# Patient Record
Sex: Female | Born: 1970 | Race: White | Hispanic: No | Marital: Married | Smoking: Current every day smoker
Health system: Southern US, Community
[De-identification: ages and names within clinical notes are randomized; demographics above are authoritative.]

## PROBLEM LIST (undated history)

## (undated) DIAGNOSIS — F32A Depression, unspecified: Secondary | ICD-10-CM

## (undated) DIAGNOSIS — F419 Anxiety disorder, unspecified: Secondary | ICD-10-CM

## (undated) DIAGNOSIS — W5501XA Bitten by cat, initial encounter: Secondary | ICD-10-CM

## (undated) DIAGNOSIS — F329 Major depressive disorder, single episode, unspecified: Secondary | ICD-10-CM

## (undated) DIAGNOSIS — C801 Malignant (primary) neoplasm, unspecified: Secondary | ICD-10-CM

## (undated) HISTORY — PX: BREAST SURGERY: SHX581

---

## 1999-10-23 ENCOUNTER — Emergency Department (HOSPITAL_COMMUNITY): Admission: EM | Admit: 1999-10-23 | Discharge: 1999-10-23 | Payer: Self-pay | Admitting: Internal Medicine

## 2018-09-20 DIAGNOSIS — M009 Pyogenic arthritis, unspecified: Secondary | ICD-10-CM | POA: Insufficient documentation

## 2018-09-21 ENCOUNTER — Observation Stay (HOSPITAL_COMMUNITY)
Admission: EM | Admit: 2018-09-21 | Discharge: 2018-09-23 | Disposition: A | Discharge status: Home / Self Care | Payer: Medicare Other | Attending: Nephrology | Admitting: Nephrology

## 2018-09-21 DIAGNOSIS — L089 Local infection of the skin and subcutaneous tissue, unspecified: Secondary | ICD-10-CM

## 2018-09-21 DIAGNOSIS — S61451A Open bite of right hand, initial encounter: Secondary | ICD-10-CM | POA: Diagnosis present

## 2018-09-21 DIAGNOSIS — F418 Other specified anxiety disorders: Secondary | ICD-10-CM | POA: Diagnosis not present

## 2018-09-21 DIAGNOSIS — L03113 Cellulitis of right upper limb: Secondary | ICD-10-CM | POA: Insufficient documentation

## 2018-09-21 DIAGNOSIS — Z853 Personal history of malignant neoplasm of breast: Secondary | ICD-10-CM | POA: Insufficient documentation

## 2018-09-21 DIAGNOSIS — F17200 Nicotine dependence, unspecified, uncomplicated: Secondary | ICD-10-CM | POA: Insufficient documentation

## 2018-09-21 DIAGNOSIS — Z79899 Other long term (current) drug therapy: Secondary | ICD-10-CM | POA: Insufficient documentation

## 2018-09-21 DIAGNOSIS — W5501XA Bitten by cat, initial encounter: Secondary | ICD-10-CM | POA: Diagnosis not present

## 2018-09-21 DIAGNOSIS — L02511 Cutaneous abscess of right hand: Secondary | ICD-10-CM | POA: Diagnosis present

## 2018-09-21 HISTORY — DX: Anxiety disorder, unspecified: F41.9

## 2018-09-21 HISTORY — DX: Bitten by cat, initial encounter: W55.01XA

## 2018-09-21 HISTORY — DX: Malignant (primary) neoplasm, unspecified: C80.1

## 2018-09-21 HISTORY — DX: Major depressive disorder, single episode, unspecified: F32.9

## 2018-09-21 HISTORY — DX: Depression, unspecified: F32.A

## 2018-09-21 LAB — CBC WITH DIFFERENTIAL/PLATELET
Abs Immature Granulocytes: 0.04 10*3/uL (ref 0.00–0.07)
BASOS PCT: 0 %
Basophils Absolute: 0 10*3/uL (ref 0.0–0.1)
EOS ABS: 0.2 10*3/uL (ref 0.0–0.5)
Eosinophils Relative: 2 %
HCT: 40.6 % (ref 36.0–46.0)
Hemoglobin: 13.1 g/dL (ref 12.0–15.0)
Immature Granulocytes: 0 %
Lymphocytes Relative: 19 %
Lymphs Abs: 2.2 10*3/uL (ref 0.7–4.0)
MCH: 30.1 pg (ref 26.0–34.0)
MCHC: 32.3 g/dL (ref 30.0–36.0)
MCV: 93.3 fL (ref 80.0–100.0)
Monocytes Absolute: 1.2 10*3/uL — ABNORMAL HIGH (ref 0.1–1.0)
Monocytes Relative: 10 %
Neutro Abs: 8.4 10*3/uL — ABNORMAL HIGH (ref 1.7–7.7)
Neutrophils Relative %: 69 %
PLATELETS: 267 10*3/uL (ref 150–400)
RBC: 4.35 MIL/uL (ref 3.87–5.11)
RDW: 13 % (ref 11.5–15.5)
WBC: 12.1 10*3/uL — AB (ref 4.0–10.5)
nRBC: 0 % (ref 0.0–0.2)

## 2018-09-21 LAB — COMPREHENSIVE METABOLIC PANEL
ALT: 18 U/L (ref 0–44)
AST: 22 U/L (ref 15–41)
Albumin: 3.9 g/dL (ref 3.5–5.0)
Alkaline Phosphatase: 69 U/L (ref 38–126)
Anion gap: 12 (ref 5–15)
BUN: 16 mg/dL (ref 6–20)
CO2: 19 mmol/L — ABNORMAL LOW (ref 22–32)
Calcium: 9.2 mg/dL (ref 8.9–10.3)
Chloride: 103 mmol/L (ref 98–111)
Creatinine, Ser: 0.87 mg/dL (ref 0.44–1.00)
Glucose, Bld: 112 mg/dL — ABNORMAL HIGH (ref 70–99)
Potassium: 3.8 mmol/L (ref 3.5–5.1)
Sodium: 134 mmol/L — ABNORMAL LOW (ref 135–145)
Total Bilirubin: 0.4 mg/dL (ref 0.3–1.2)
Total Protein: 7 g/dL (ref 6.5–8.1)

## 2018-09-21 LAB — LACTIC ACID, PLASMA: Lactic Acid, Venous: 1.2 mmol/L (ref 0.5–1.9)

## 2018-09-21 LAB — I-STAT BETA HCG BLOOD, ED (MC, WL, AP ONLY)

## 2018-09-21 MED ORDER — SODIUM CHLORIDE 0.9% FLUSH
3.0000 mL | Freq: Once | INTRAVENOUS | Status: AC
  Administered 2018-09-22: 3 mL via INTRAVENOUS

## 2018-09-21 MED ORDER — SODIUM CHLORIDE 0.9 % IV SOLN
3.0000 g | Freq: Once | INTRAVENOUS | Status: AC
  Administered 2018-09-22: 3 g via INTRAVENOUS
  Filled 2018-09-21: qty 3

## 2018-09-21 MED ORDER — SULFAMETHOXAZOLE-TRIMETHOPRIM 800-160 MG PO TABS
1.0000 | ORAL_TABLET | Freq: Once | ORAL | Status: AC
  Administered 2018-09-22: 1 via ORAL
  Filled 2018-09-21: qty 1

## 2018-09-21 NOTE — ED Provider Notes (Addendum)
[facility] EMERGENCY DEPARTMENT Provider Note   CSN: [id number] Arrival date & time: 09/21/18  2107     History   Chief Complaint Chief Complaint  Patient presents with  . Animal Bite    HPI Audrey Becker is a 48 y.o. female.  Patient presents to the emergency department for evaluation of pain and swelling of her hand after cat bite.  She was bitten on the right hand by her cat while she was taking the cat to the vet 2 days ago.  She has at least 4 puncture wounds on the dorsal aspect of the right hand.  She reports increasing pain, redness, now spreading up to the forearm.  She has difficulty flexing her fingers because of pain.  She thinks she has been having fever at home.     No past medical history on file.  There are no active problems to display for this patient.      OB History   No obstetric history on file.      Home Medications    Prior to Admission medications   Not on File    Family History No family history on file.  Social History Social History   Tobacco Use  . Smoking status: Not on file  Substance Use Topics  . Alcohol use: Not on file  . Drug use: Not on file     Allergies   Patient has no allergy information on record.   Review of Systems Review of Systems  Constitutional: Positive for fever.  Skin: Positive for wound.  All other systems reviewed and are negative.    Physical Exam Updated Vital Signs BP 118/89 (BP Location: Right Arm)   Pulse 85   Temp 98.4 F (36.9 C) (Oral)   Resp 18   SpO2 98%   Physical Exam Vitals signs and nursing note reviewed.  Constitutional:      General: She is not in acute distress.    Appearance: Normal appearance. She is well-developed.  HENT:     Head: Normocephalic and atraumatic.     Right Ear: Hearing normal.     Left Ear: Hearing normal.     Nose: Nose normal.  Eyes:     Conjunctiva/sclera: Conjunctivae normal.     Pupils: Pupils are equal, round, and  reactive to light.  Neck:     Musculoskeletal: Normal range of motion and neck supple.  Cardiovascular:     Rate and Rhythm: Regular rhythm.     Heart sounds: S1 normal and S2 normal. No murmur. No friction rub. No gallop.   Pulmonary:     Effort: Pulmonary effort is normal. No respiratory distress.     Breath sounds: Normal breath sounds.  Chest:     Chest wall: No tenderness.  Abdominal:     General: Bowel sounds are normal.     Palpations: Abdomen is soft.     Tenderness: There is no abdominal tenderness. There is no guarding or rebound. Negative signs include Murphy's sign and McBurney's sign.     Hernia: No hernia is present.  Musculoskeletal: Normal range of motion.     Right hand: She exhibits tenderness (dorsal) and swelling.  Skin:    General: Skin is warm and dry.     Findings: No rash.     Comments: 4 small punctures noted on dorsal aspect of hand in the area of first and second metacarpals.  Overlying tenderness, erythema, warmth and swelling.  No fluctuance, induration or sign  of fluid collection.  Most of the hand is swollen and there is some erythema of the distal forearm  Neurological:     Mental Status: She is alert and oriented to person, place, and time.     GCS: GCS eye subscore is 4. GCS verbal subscore is 5. GCS motor subscore is 6.     Cranial Nerves: No cranial nerve deficit.     Sensory: No sensory deficit.     Coordination: Coordination normal.  Psychiatric:        Speech: Speech normal.        Behavior: Behavior normal.        Thought Content: Thought content normal.           ED Treatments / Results  Labs (all labs ordered are listed, but only abnormal results are displayed) Labs Reviewed  COMPREHENSIVE METABOLIC PANEL - Abnormal; Notable for the following components:      Result Value   Sodium 134 (*)    CO2 19 (*)    Glucose, Bld 112 (*)    All other components within normal limits  CBC WITH DIFFERENTIAL/PLATELET - Abnormal; Notable for  the following components:   WBC 12.1 (*)    Neutro Abs 8.4 (*)    Monocytes Absolute 1.2 (*)    All other components within normal limits  LACTIC ACID, PLASMA  I-STAT BETA HCG BLOOD, ED ([facility], [facility], [facility] ONLY)    EKG None  Radiology Dg Hand Complete Right  Result Date: 09/22/2018 CLINICAL DATA:  Right hand pain, swelling, redness, and warm to touch. Patient was bitten by cat 2 days ago. EXAM: RIGHT HAND - COMPLETE 3+ VIEW COMPARISON:  None. FINDINGS: Dorsal soft tissue swelling over the right hand. No radiopaque soft tissue foreign bodies or gas collections identified. Bones appear intact. No evidence of acute fracture or dislocation. No focal bone sclerosis or bone lesion. Joint spaces are preserved. IMPRESSION: Soft tissue swelling. No acute bony abnormalities. Electronically Signed   By: Lucienne Capers M.D.   On: 09/22/2018 00:30    Procedures Procedures (including critical care time)  Medications Ordered in ED Medications  sodium chloride flush (NS) 0.9 % injection 3 mL (has no administration in time range)  Ampicillin-Sulbactam (UNASYN) 3 g in sodium chloride 0.9 % 100 mL IVPB (3 g Intravenous New Bag/Given 09/22/18 0040)  sulfamethoxazole-trimethoprim (BACTRIM DS,SEPTRA DS) 800-160 MG per tablet 1 tablet (1 tablet Oral Given 09/22/18 0019)     Initial Impression / Assessment and Plan / ED Course  I have reviewed the triage vital signs and the nursing notes.  Pertinent labs & imaging results that were available during my care of the patient were reviewed by me and considered in my medical decision making (see chart for details).     Patient presents to the emergency department for evaluation of significant swelling of the right hand after being bit by her cat 2 days ago.  Examination is consistent with significant cellulitis.  Patient has had fevers at home but no fever here in the ER.  Vital signs are normal, no concern for sepsis at this time.  No signs of abscess on examination.   X-ray negative, no retained foreign bodies.  No osteomyelitis.  Administered Unasyn for Pasteurella coverage, Bactrim for staph coverage.  Discussed with Dr. Edmonia Lynch, covering for hand surgery tonight.  Patient will be seen in the morning.  Final Clinical Impressions(s) / ED Diagnoses   Final diagnoses:  Cat bite, initial encounter  Cellulitis  of right upper extremity    ED Discharge Orders    None       , Gwenyth Allegra, MD 09/22/18 9977    Orpah Greek, MD 09/22/18 [id number]

## 2018-09-21 NOTE — ED Triage Notes (Signed)
Pt reports she was bit on her R hand by her own cat a few days ago. Pt presents with redness and moderate swelling extending up to R wrist. States cat shots are UTD, indoor cat only.

## 2018-09-22 ENCOUNTER — Observation Stay (HOSPITAL_COMMUNITY): Payer: Medicare Other

## 2018-09-22 ENCOUNTER — Emergency Department (HOSPITAL_COMMUNITY): Payer: Medicare Other

## 2018-09-22 ENCOUNTER — Observation Stay (HOSPITAL_COMMUNITY): Payer: Medicare Other | Admitting: Certified Registered"

## 2018-09-22 ENCOUNTER — Encounter (HOSPITAL_COMMUNITY): Payer: Self-pay | Admitting: Family Medicine

## 2018-09-22 ENCOUNTER — Encounter (HOSPITAL_COMMUNITY)
Admission: EM | Disposition: A | Discharge status: Home / Self Care | Payer: Self-pay | Source: Home / Self Care | Attending: Emergency Medicine

## 2018-09-22 ENCOUNTER — Other Ambulatory Visit: Payer: Self-pay

## 2018-09-22 DIAGNOSIS — Z853 Personal history of malignant neoplasm of breast: Secondary | ICD-10-CM | POA: Insufficient documentation

## 2018-09-22 DIAGNOSIS — F418 Other specified anxiety disorders: Secondary | ICD-10-CM | POA: Diagnosis not present

## 2018-09-22 DIAGNOSIS — L089 Local infection of the skin and subcutaneous tissue, unspecified: Secondary | ICD-10-CM | POA: Diagnosis present

## 2018-09-22 DIAGNOSIS — Z79899 Other long term (current) drug therapy: Secondary | ICD-10-CM | POA: Diagnosis not present

## 2018-09-22 DIAGNOSIS — W5501XA Bitten by cat, initial encounter: Secondary | ICD-10-CM | POA: Insufficient documentation

## 2018-09-22 DIAGNOSIS — S61451A Open bite of right hand, initial encounter: Secondary | ICD-10-CM | POA: Diagnosis not present

## 2018-09-22 DIAGNOSIS — L03113 Cellulitis of right upper limb: Secondary | ICD-10-CM | POA: Diagnosis not present

## 2018-09-22 HISTORY — PX: I & D EXTREMITY: SHX5045

## 2018-09-22 LAB — CBC WITH DIFFERENTIAL/PLATELET
Abs Immature Granulocytes: 0.03 10*3/uL (ref 0.00–0.07)
Basophils Absolute: 0 10*3/uL (ref 0.0–0.1)
Basophils Relative: 0 %
Eosinophils Absolute: 0.2 10*3/uL (ref 0.0–0.5)
Eosinophils Relative: 2 %
HCT: 37 % (ref 36.0–46.0)
Hemoglobin: 12.7 g/dL (ref 12.0–15.0)
Immature Granulocytes: 0 %
Lymphocytes Relative: 22 %
Lymphs Abs: 2.6 10*3/uL (ref 0.7–4.0)
MCH: 31 pg (ref 26.0–34.0)
MCHC: 34.3 g/dL (ref 30.0–36.0)
MCV: 90.2 fL (ref 80.0–100.0)
Monocytes Absolute: 1.3 10*3/uL — ABNORMAL HIGH (ref 0.1–1.0)
Monocytes Relative: 11 %
Neutro Abs: 7.6 10*3/uL (ref 1.7–7.7)
Neutrophils Relative %: 65 %
Platelets: 260 10*3/uL (ref 150–400)
RBC: 4.1 MIL/uL (ref 3.87–5.11)
RDW: 13.1 % (ref 11.5–15.5)
WBC: 11.8 10*3/uL — ABNORMAL HIGH (ref 4.0–10.5)
nRBC: 0 % (ref 0.0–0.2)

## 2018-09-22 LAB — SURGICAL PCR SCREEN
MRSA, PCR: NEGATIVE
Staphylococcus aureus: POSITIVE — AB

## 2018-09-22 LAB — BASIC METABOLIC PANEL
Anion gap: 9 (ref 5–15)
BUN: 16 mg/dL (ref 6–20)
CO2: 23 mmol/L (ref 22–32)
Calcium: 9 mg/dL (ref 8.9–10.3)
Chloride: 107 mmol/L (ref 98–111)
Creatinine, Ser: 0.94 mg/dL (ref 0.44–1.00)
GFR calc Af Amer: >60 mL/min (ref >60)
GFR calc non Af Amer: >60 mL/min (ref >60)
Glucose, Bld: 108 mg/dL — ABNORMAL HIGH (ref 70–99)
POTASSIUM: 3.7 mmol/L (ref 3.5–5.1)
Sodium: 139 mmol/L (ref 135–145)

## 2018-09-22 LAB — HIV ANTIBODY (ROUTINE TESTING W REFLEX): HIV Screen 4th Generation wRfx: NONREACTIVE

## 2018-09-22 SURGERY — IRRIGATION AND DEBRIDEMENT EXTREMITY
Anesthesia: General | Laterality: Right

## 2018-09-22 MED ORDER — METOCLOPRAMIDE HCL 5 MG/ML IJ SOLN: 5.0000 mg | Freq: Three times a day (TID) | INTRAMUSCULAR | Status: DC | PRN

## 2018-09-22 MED ORDER — SODIUM CHLORIDE 0.9 % IV SOLN
INTRAVENOUS | Status: AC
  Administered 2018-09-22: 03:00:00 via INTRAVENOUS

## 2018-09-22 MED ORDER — SODIUM CHLORIDE 0.9 % IV SOLN
3.0000 g | Freq: Four times a day (QID) | INTRAVENOUS | Status: DC
  Administered 2018-09-22 (×2): 3 g via INTRAVENOUS
  Filled 2018-09-22 (×5): qty 3

## 2018-09-22 MED ORDER — ONDANSETRON HCL 4 MG/2ML IJ SOLN: 4.0000 mg | Freq: Four times a day (QID) | INTRAMUSCULAR | Status: DC | PRN

## 2018-09-22 MED ORDER — ONDANSETRON HCL 4 MG/2ML IJ SOLN
INTRAMUSCULAR | Status: DC | PRN
  Administered 2018-09-22: 4 mg via INTRAVENOUS

## 2018-09-22 MED ORDER — LAMOTRIGINE 25 MG PO TABS
150.0000 mg | ORAL_TABLET | Freq: Every day | ORAL | Status: DC
  Administered 2018-09-22 (×2): 150 mg via ORAL
  Filled 2018-09-22 (×2): qty 1
  Filled 2018-09-22: qty 2

## 2018-09-22 MED ORDER — FENTANYL CITRATE (PF) 250 MCG/5ML IJ SOLN
INTRAMUSCULAR | Status: DC | PRN
  Administered 2018-09-22: 50 ug via INTRAVENOUS

## 2018-09-22 MED ORDER — ACETAMINOPHEN 500 MG PO TABS
1000.0000 mg | ORAL_TABLET | Freq: Once | ORAL | Status: AC
  Administered 2018-09-22: 1000 mg via ORAL
  Filled 2018-09-22: qty 2

## 2018-09-22 MED ORDER — PROPOFOL 10 MG/ML IV BOLUS
INTRAVENOUS | Status: AC
  Filled 2018-09-22: qty 20

## 2018-09-22 MED ORDER — KETOROLAC TROMETHAMINE 15 MG/ML IJ SOLN
15.0000 mg | Freq: Four times a day (QID) | INTRAMUSCULAR | Status: AC
  Administered 2018-09-22 – 2018-09-23 (×4): 15 mg via INTRAVENOUS
  Filled 2018-09-22 (×3): qty 1

## 2018-09-22 MED ORDER — CEFAZOLIN SODIUM-DEXTROSE 2-4 GM/100ML-% IV SOLN
2.0000 g | INTRAVENOUS | Status: AC
  Administered 2018-09-22: 2 g via INTRAVENOUS
  Filled 2018-09-22: qty 100

## 2018-09-22 MED ORDER — DOCUSATE SODIUM 100 MG PO CAPS
100.0000 mg | ORAL_CAPSULE | Freq: Two times a day (BID) | ORAL | Status: DC
  Administered 2018-09-22 – 2018-09-23 (×2): 100 mg via ORAL
  Filled 2018-09-22 (×2): qty 1

## 2018-09-22 MED ORDER — MORPHINE SULFATE (PF) 4 MG/ML IV SOLN
4.0000 mg | INTRAVENOUS | Status: DC | PRN
  Administered 2018-09-22 (×5): 4 mg via INTRAVENOUS
  Filled 2018-09-22 (×4): qty 1

## 2018-09-22 MED ORDER — HYDROMORPHONE HCL 1 MG/ML IJ SOLN: 0.2500 mg | INTRAMUSCULAR | Status: DC | PRN

## 2018-09-22 MED ORDER — ONDANSETRON HCL 4 MG PO TABS: 4.0000 mg | ORAL_TABLET | Freq: Four times a day (QID) | ORAL | Status: DC | PRN

## 2018-09-22 MED ORDER — TRAZODONE HCL 100 MG PO TABS
100.0000 mg | ORAL_TABLET | Freq: Every day | ORAL | Status: DC
  Administered 2018-09-22 (×2): 100 mg via ORAL
  Filled 2018-09-22 (×2): qty 1

## 2018-09-22 MED ORDER — HYDROXYZINE HCL 25 MG PO TABS: 25.0000 mg | ORAL_TABLET | Freq: Three times a day (TID) | ORAL | Status: DC | PRN

## 2018-09-22 MED ORDER — MIDAZOLAM HCL 2 MG/2ML IJ SOLN
INTRAMUSCULAR | Status: DC | PRN
  Administered 2018-09-22: 2 mg via INTRAVENOUS

## 2018-09-22 MED ORDER — CHLORHEXIDINE GLUCONATE 4 % EX LIQD
60.0000 mL | Freq: Once | CUTANEOUS | Status: AC
  Administered 2018-09-22: 4 via TOPICAL
  Filled 2018-09-22: qty 60

## 2018-09-22 MED ORDER — ONDANSETRON HCL 4 MG/2ML IJ SOLN
INTRAMUSCULAR | Status: AC
  Filled 2018-09-22: qty 2

## 2018-09-22 MED ORDER — GADOBUTROL 1 MMOL/ML IV SOLN
7.5000 mL | Freq: Once | INTRAVENOUS | Status: AC | PRN
  Administered 2018-09-22: 6 mL via INTRAVENOUS

## 2018-09-22 MED ORDER — GABAPENTIN 300 MG PO CAPS
300.0000 mg | ORAL_CAPSULE | Freq: Two times a day (BID) | ORAL | Status: DC
  Administered 2018-09-22 – 2018-09-23 (×2): 300 mg via ORAL
  Filled 2018-09-22 (×2): qty 1

## 2018-09-22 MED ORDER — FENTANYL CITRATE (PF) 250 MCG/5ML IJ SOLN
INTRAMUSCULAR | Status: AC
  Filled 2018-09-22: qty 5

## 2018-09-22 MED ORDER — PROPOFOL 10 MG/ML IV BOLUS
INTRAVENOUS | Status: DC | PRN
  Administered 2018-09-22: 120 mg via INTRAVENOUS

## 2018-09-22 MED ORDER — DULOXETINE HCL 60 MG PO CPEP
60.0000 mg | ORAL_CAPSULE | Freq: Two times a day (BID) | ORAL | Status: DC
  Administered 2018-09-22 – 2018-09-23 (×3): 60 mg via ORAL
  Filled 2018-09-22 (×3): qty 1

## 2018-09-22 MED ORDER — GABAPENTIN 300 MG PO CAPS
300.0000 mg | ORAL_CAPSULE | Freq: Once | ORAL | Status: AC
  Administered 2018-09-22: 300 mg via ORAL
  Filled 2018-09-22: qty 1

## 2018-09-22 MED ORDER — ACETAMINOPHEN 325 MG PO TABS: 325.0000 mg | ORAL_TABLET | Freq: Four times a day (QID) | ORAL | Status: DC | PRN

## 2018-09-22 MED ORDER — METOCLOPRAMIDE HCL 5 MG PO TABS: 5.0000 mg | ORAL_TABLET | Freq: Three times a day (TID) | ORAL | Status: DC | PRN

## 2018-09-22 MED ORDER — HYDROCODONE-ACETAMINOPHEN 5-325 MG PO TABS
1.0000 | ORAL_TABLET | ORAL | Status: DC | PRN
  Administered 2018-09-22: 2 via ORAL
  Administered 2018-09-22: 1 via ORAL
  Administered 2018-09-22: 2 via ORAL
  Filled 2018-09-22: qty 2
  Filled 2018-09-22: qty 1
  Filled 2018-09-22 (×2): qty 2
  Filled 2018-09-22: qty 1

## 2018-09-22 MED ORDER — DEXAMETHASONE SODIUM PHOSPHATE 10 MG/ML IJ SOLN
INTRAMUSCULAR | Status: DC | PRN
  Administered 2018-09-22: 10 mg via INTRAVENOUS

## 2018-09-22 MED ORDER — SODIUM CHLORIDE 0.9 % IV SOLN
3.0000 g | Freq: Four times a day (QID) | INTRAVENOUS | Status: DC
  Administered 2018-09-22 – 2018-09-23 (×3): 3 g via INTRAVENOUS
  Filled 2018-09-22 (×5): qty 3

## 2018-09-22 MED ORDER — MIDAZOLAM HCL 2 MG/2ML IJ SOLN
INTRAMUSCULAR | Status: AC
  Filled 2018-09-22: qty 2

## 2018-09-22 MED ORDER — POLYETHYLENE GLYCOL 3350 17 G PO PACK: 17.0000 g | PACK | Freq: Every day | ORAL | Status: DC | PRN

## 2018-09-22 MED ORDER — SODIUM CHLORIDE 0.9 % IR SOLN
Status: DC | PRN
  Administered 2018-09-22: 3000 mL

## 2018-09-22 MED ORDER — ACETAMINOPHEN 325 MG PO TABS: 650.0000 mg | ORAL_TABLET | Freq: Four times a day (QID) | ORAL | Status: DC | PRN

## 2018-09-22 MED ORDER — LACTATED RINGERS IV SOLN
INTRAVENOUS | Status: DC
  Administered 2018-09-22: 19:00:00 via INTRAVENOUS

## 2018-09-22 MED ORDER — ACETAMINOPHEN 650 MG RE SUPP: 650.0000 mg | Freq: Four times a day (QID) | RECTAL | Status: DC | PRN

## 2018-09-22 MED ORDER — LACTATED RINGERS IV SOLN
INTRAVENOUS | Status: DC
  Administered 2018-09-22: 15:00:00 via INTRAVENOUS

## 2018-09-22 MED ORDER — 0.9 % SODIUM CHLORIDE (POUR BTL) OPTIME
TOPICAL | Status: DC | PRN
  Administered 2018-09-22: 1000 mL

## 2018-09-22 MED ORDER — LIDOCAINE 2% (20 MG/ML) 5 ML SYRINGE
INTRAMUSCULAR | Status: DC | PRN
  Administered 2018-09-22: 60 mg via INTRAVENOUS

## 2018-09-22 MED ORDER — DEXAMETHASONE SODIUM PHOSPHATE 10 MG/ML IJ SOLN
INTRAMUSCULAR | Status: AC
  Filled 2018-09-22: qty 1

## 2018-09-22 SURGICAL SUPPLY — 58 items
BANDAGE ACE 4X5 VEL STRL LF (GAUZE/BANDAGES/DRESSINGS) ×3 IMPLANT
BANDAGE ACE 6X5 VEL STRL LF (GAUZE/BANDAGES/DRESSINGS) ×3 IMPLANT
BANDAGE ESMARK 6X9 LF (GAUZE/BANDAGES/DRESSINGS) IMPLANT
BLADE SURG 10 STRL SS (BLADE) ×3 IMPLANT
BNDG CMPR 9X4 STRL LF SNTH (GAUZE/BANDAGES/DRESSINGS) ×1
BNDG CMPR 9X6 STRL LF SNTH (GAUZE/BANDAGES/DRESSINGS)
BNDG CMPR MD 5X2 ELC HKLP STRL (GAUZE/BANDAGES/DRESSINGS) ×1
BNDG COHESIVE 4X5 TAN STRL (GAUZE/BANDAGES/DRESSINGS) ×3 IMPLANT
BNDG ELASTIC 2 VLCR STRL LF (GAUZE/BANDAGES/DRESSINGS) ×2 IMPLANT
BNDG ESMARK 4X9 LF (GAUZE/BANDAGES/DRESSINGS) ×2 IMPLANT
BNDG ESMARK 6X9 LF (GAUZE/BANDAGES/DRESSINGS)
BNDG GAUZE ELAST 4 BULKY (GAUZE/BANDAGES/DRESSINGS) ×3 IMPLANT
CONT SPEC 4OZ CLIKSEAL STRL BL (MISCELLANEOUS) ×2 IMPLANT
COVER SURGICAL LIGHT HANDLE (MISCELLANEOUS) ×3 IMPLANT
COVER WAND RF STERILE (DRAPES) ×3 IMPLANT
CUFF TOURN SGL LL 12 NO SLV (MISCELLANEOUS) IMPLANT
CUFF TOURNIQUET SINGLE 34IN LL (TOURNIQUET CUFF) IMPLANT
DRAIN PENROSE 1/4X12 LTX STRL (WOUND CARE) ×2 IMPLANT
DRAPE SURG 17X23 STRL (DRAPES) IMPLANT
DRAPE U-SHAPE 47X51 STRL (DRAPES) IMPLANT
DRSG ADAPTIC 3X8 NADH LF (GAUZE/BANDAGES/DRESSINGS) ×2 IMPLANT
DRSG PAD ABDOMINAL 8X10 ST (GAUZE/BANDAGES/DRESSINGS) ×3 IMPLANT
DURAPREP 26ML APPLICATOR (WOUND CARE) ×3 IMPLANT
ELECT REM PT RETURN 9FT ADLT (ELECTROSURGICAL)
ELECTRODE REM PT RTRN 9FT ADLT (ELECTROSURGICAL) IMPLANT
EVACUATOR 1/8 PVC DRAIN (DRAIN) IMPLANT
FACESHIELD WRAPAROUND (MASK) ×3 IMPLANT
FACESHIELD WRAPAROUND OR TEAM (MASK) ×1 IMPLANT
GAUZE SPONGE 4X4 12PLY STRL (GAUZE/BANDAGES/DRESSINGS) ×3 IMPLANT
GAUZE XEROFORM 1X8 LF (GAUZE/BANDAGES/DRESSINGS) ×3 IMPLANT
GLOVE BIO SURGEON STRL SZ7.5 (GLOVE) ×6 IMPLANT
GLOVE BIOGEL PI IND STRL 8 (GLOVE) ×2 IMPLANT
GLOVE BIOGEL PI INDICATOR 8 (GLOVE) ×4
GOWN STRL REUS W/ TWL LRG LVL3 (GOWN DISPOSABLE) ×3 IMPLANT
GOWN STRL REUS W/TWL LRG LVL3 (GOWN DISPOSABLE) ×9
HANDPIECE INTERPULSE COAX TIP (DISPOSABLE)
KIT BASIN OR (CUSTOM PROCEDURE TRAY) ×3 IMPLANT
KIT TURNOVER KIT B (KITS) ×3 IMPLANT
MANIFOLD NEPTUNE II (INSTRUMENTS) ×3 IMPLANT
NEEDLE 25GAX1.5 (MISCELLANEOUS) IMPLANT
NS IRRIG 1000ML POUR BTL (IV SOLUTION) ×3 IMPLANT
PACK ORTHO EXTREMITY (CUSTOM PROCEDURE TRAY) ×3 IMPLANT
PAD ARMBOARD 7.5X6 YLW CONV (MISCELLANEOUS) ×6 IMPLANT
SET CYSTO W/LG BORE CLAMP LF (SET/KITS/TRAYS/PACK) IMPLANT
SET HNDPC FAN SPRY TIP SCT (DISPOSABLE) IMPLANT
SPONGE LAP 18X18 X RAY DECT (DISPOSABLE) IMPLANT
STOCKINETTE IMPERVIOUS 9X36 MD (GAUZE/BANDAGES/DRESSINGS) ×3 IMPLANT
SUT ETHILON 3 0 PS 1 (SUTURE) IMPLANT
SUT PDS AB 2-0 CT1 27 (SUTURE) IMPLANT
SWAB CULTURE ESWAB REG 1ML (MISCELLANEOUS) IMPLANT
SYR CONTROL 10ML LL (SYRINGE) IMPLANT
TOWEL OR 17X24 6PK STRL BLUE (TOWEL DISPOSABLE) ×3 IMPLANT
TOWEL OR 17X26 10 PK STRL BLUE (TOWEL DISPOSABLE) ×3 IMPLANT
TUBE CONNECTING 12'X1/4 (SUCTIONS) ×1
TUBE CONNECTING 12X1/4 (SUCTIONS) ×2 IMPLANT
TUBING CYSTO DISP (UROLOGICAL SUPPLIES) IMPLANT
UNDERPAD 30X30 (UNDERPADS AND DIAPERS) ×3 IMPLANT
YANKAUER SUCT BULB TIP NO VENT (SUCTIONS) ×3 IMPLANT

## 2018-09-22 NOTE — H&P (View-Only) (Signed)
   ORTHOPAEDIC CONSULTATION  REQUESTING PHYSICIAN: Schertz, Robert, MD  Chief Complaint: Right hand pain, cat bite  HPI: Audrey Becker is a 48 y.o. female who complains of increasing pain, swelling, and difficulty moving right hand after a dorsal cat bite 3 days ago.  She was bringing her cat to the vet after having recently moved from [place].  She had no antibiotic therapy prior to arrival.  She was given Bactrim and Unasyn in the ED.  Unasyn was continued.  Orthopedics was consulted for evaluation.  Today she has mainly dorsal but some palmar hand pain and difficulty opening and closing hand.  She denies fever.  Last meal 130 a.m. Denies history of MI, CVA, DVT, PE.    Past Medical History:  Diagnosis Date  . Anxiety   . Depression    Past Surgical History:  Procedure Laterality Date  . BREAST SURGERY     Social History   Socioeconomic History  . Marital status: Married    Spouse name: Not on file  . Number of children: Not on file  . Years of education: Not on file  . Highest education level: Not on file  Occupational History  . Not on file  Social Needs  . Financial resource strain: Not on file  . Food insecurity:    Worry: Not on file    Inability: Not on file  . Transportation needs:    Medical: Not on file    Non-medical: Not on file  Tobacco Use  . Smoking status: Not on file  Substance and Sexual Activity  . Alcohol use: Not on file  . Drug use: Not on file  . Sexual activity: Not on file  Lifestyle  . Physical activity:    Days per week: Not on file    Minutes per session: Not on file  . Stress: Not on file  Relationships  . Social connections:    Talks on phone: Not on file    Gets together: Not on file    Attends religious service: Not on file    Active member of club or organization: Not on file    Attends meetings of clubs or organizations: Not on file    Relationship status: Not on file  Other Topics Concern  . Not on file  Social  History Narrative  . Not on file   History reviewed. No pertinent family history. No Known Allergies Prior to Admission medications   Medication Sig Start Date End Date Taking? Authorizing Provider  diclofenac sodium (VOLTAREN) 1 % GEL Apply 2 g topically 4 (four) times daily as needed (pain).   Yes [provider]  DULoxetine (CYMBALTA) 60 MG capsule Take 60 mg by mouth 2 (two) times daily.   Yes [provider]  hydroxypropyl methylcellulose / hypromellose (ISOPTO TEARS / GONIOVISC) 2.5 % ophthalmic solution Place 1 drop into both eyes 3 (three) times daily as needed for dry eyes.   Yes [provider]  hydrOXYzine (ATARAX/VISTARIL) 25 MG tablet Take 25 mg by mouth 3 (three) times daily as needed for anxiety.   Yes [provider]  lamoTRIgine (LAMICTAL) 150 MG tablet Take 150 mg by mouth at bedtime.   Yes [provider]  Multiple Vitamin (MULTIVITAMIN WITH MINERALS) TABS tablet Take 1 tablet by mouth daily.   Yes [provider]  traZODone (DESYREL) 100 MG tablet Take 100 mg by mouth at bedtime.   Yes [provider]   Dg Hand Complete Right    Result Date: 09/22/2018 CLINICAL DATA:  Right hand pain, swelling, redness, and warm to touch. Patient was bitten by cat 2 days ago. EXAM: RIGHT HAND - COMPLETE 3+ VIEW COMPARISON:  None. FINDINGS: Dorsal soft tissue swelling over the right hand. No radiopaque soft tissue foreign bodies or gas collections identified. Bones appear intact. No evidence of acute fracture or dislocation. No focal bone sclerosis or bone lesion. Joint spaces are preserved. IMPRESSION: Soft tissue swelling. No acute bony abnormalities. Electronically Signed   By: William  Stevens M.D.   On: 09/22/2018 00:30    Positive ROS: All other systems have been reviewed and were otherwise negative with the exception of those mentioned in the HPI and as above.  Objective: Labs cbc Recent Labs    09/21/18 2117  09/22/18 0328  WBC 12.1* 11.8*  HGB 13.1 12.7  HCT 40.6 37.0  PLT 267 260    Labs inflam No results for input(s): CRP in the last 72 hours.  Invalid input(s): ESR  Labs coag No results for input(s): INR, PTT in the last 72 hours.  Invalid input(s): PT  Recent Labs    09/21/18 2117 09/22/18 0328  NA 134* 139  K 3.8 3.7  CL 103 107  CO2 19* 23  GLUCOSE 112* 108*  BUN 16 16  CREATININE 0.87 0.94  CALCIUM 9.2 9.0    Physical Exam: Vitals:   09/22/18 0024 09/22/18 0302  BP: 118/89 124/80  Pulse: 85 84  Resp: 18 18  Temp:  98.6 F (37 C)  SpO2: 98% 98%   General: Alert, no acute distress.  Upright in bed.  Calm, conversant.  Husband, Audrey Becker at bedside. Mental status: Alert and Oriented x3 Neurologic: Speech Clear and organized, no gross focal findings or movement disorder appreciated. Respiratory: No cyanosis, no use of accessory musculature Cardiovascular: No pedal edema GI: Abdomen is soft and non-tender, non-distended. Skin: Warm and dry.  Extremities: Warm and well perfused w/o edema Psychiatric: Patient is competent for consent with normal mood and affect  MUSCULOSKELETAL:  RUE: Right hand with significant dorsal swelling and erythema.  There are several puncture wounds about the dorsal aspect of her hand.  Sensation is intact throughout.  Pain with active and passive motion and limited grip.  Discomfort most significant about the ring finger.  She does have some pain palmar side. Other extremities are atraumatic with painless ROM and NVI.  Assessment / Plan: Principal Problem:   Infected cat bite of hand, right, initial encounter Active Problems:   Depression with anxiety   Right dorsal hand cat bite, infection Audrey Becker obtain MRI to evaluate for tendon involvement/fluid collection. N.p.o. Plan for I&D today Continue antibiotic therapy  The risks benefits and alternatives of the procedure were discussed with the patient including but not limited to  infection, bleeding, nerve injury, the need for revision surgery, blood clots, cardiopulmonary complications, morbidity, mortality, among others.  The patient verbalizes understanding and wishes to proceed.     Contact information:  Audrey Murphy MD, Audrey Martensen PA-C  Audrey Calvin Becker III PA-C 09/22/2018 7:58 AM  

## 2018-09-22 NOTE — Anesthesia Procedure Notes (Signed)
Procedure Name: LMA Insertion Date/Time: 09/22/2018 4:45 PM Performed by: Barrington Ellison, CRNA Pre-anesthesia Checklist: Patient identified, Emergency Drugs available, Suction available and Patient being monitored Patient Re-evaluated:Patient Re-evaluated prior to induction Oxygen Delivery Method: Circle System Utilized Preoxygenation: Pre-oxygenation with 100% oxygen Induction Type: IV induction Ventilation: Mask ventilation without difficulty LMA: LMA inserted LMA Size: 4.0 Number of attempts: 1 Placement Confirmation: positive ETCO2 Tube secured with: Tape Dental Injury: Teeth and Oropharynx as per pre-operative assessment

## 2018-09-22 NOTE — Transfer of Care (Signed)
Immediate Anesthesia Transfer of Care Note  Patient: Audrey Becker  Procedure(s) Performed: IRRIGATION AND DEBRIDEMENT RIGHT DORSAL HAND (Right )  Patient Location: PACU  Anesthesia Type:General  Level of Consciousness: lethargic and responds to stimulation  Airway & Oxygen Therapy: Patient Spontanous Breathing  Post-op Assessment: Report given to RN  Post vital signs: Reviewed and stable  Last Vitals:  Vitals Value Taken Time  BP    Temp    Pulse    Resp 13 09/22/2018  5:30 PM  SpO2    Vitals shown include unvalidated device data.  Last Pain:  Vitals:   09/22/18 1321  TempSrc: Oral  PainSc:       Patients Stated Pain Goal: 4 (62/44/69 5072)  Complications: No apparent anesthesia complications

## 2018-09-22 NOTE — Consult Note (Addendum)
   ORTHOPAEDIC CONSULTATION  REQUESTING PHYSICIAN: Schertz, Robert, MD  Chief Complaint: Right hand pain, cat bite  HPI: Audrey Becker is a 47 y.o. female who complains of increasing pain, swelling, and difficulty moving right hand after a dorsal cat bite 3 days ago.  She was bringing her cat to the vet after having recently moved from [place].  She had no antibiotic therapy prior to arrival.  She was given Bactrim and Unasyn in the ED.  Unasyn was continued.  Orthopedics was consulted for evaluation.  Today she has mainly dorsal but some palmar hand pain and difficulty opening and closing hand.  She denies fever.  Last meal 130 a.m. Denies history of MI, CVA, DVT, PE.    Past Medical History:  Diagnosis Date  . Anxiety   . Depression    Past Surgical History:  Procedure Laterality Date  . BREAST SURGERY     Social History   Socioeconomic History  . Marital status: Married    Spouse name: Not on file  . Number of children: Not on file  . Years of education: Not on file  . Highest education level: Not on file  Occupational History  . Not on file  Social Needs  . Financial resource strain: Not on file  . Food insecurity:    Worry: Not on file    Inability: Not on file  . Transportation needs:    Medical: Not on file    Non-medical: Not on file  Tobacco Use  . Smoking status: Not on file  Substance and Sexual Activity  . Alcohol use: Not on file  . Drug use: Not on file  . Sexual activity: Not on file  Lifestyle  . Physical activity:    Days per week: Not on file    Minutes per session: Not on file  . Stress: Not on file  Relationships  . Social connections:    Talks on phone: Not on file    Gets together: Not on file    Attends religious service: Not on file    Active member of club or organization: Not on file    Attends meetings of clubs or organizations: Not on file    Relationship status: Not on file  Other Topics Concern  . Not on file  Social  History Narrative  . Not on file   History reviewed. No pertinent family history. No Known Allergies Prior to Admission medications   Medication Sig Start Date End Date Taking? Authorizing Provider  diclofenac sodium (VOLTAREN) 1 % GEL Apply 2 g topically 4 (four) times daily as needed (pain).   Yes [provider]  DULoxetine (CYMBALTA) 60 MG capsule Take 60 mg by mouth 2 (two) times daily.   Yes [provider]  hydroxypropyl methylcellulose / hypromellose (ISOPTO TEARS / GONIOVISC) 2.5 % ophthalmic solution Place 1 drop into both eyes 3 (three) times daily as needed for dry eyes.   Yes [provider]  hydrOXYzine (ATARAX/VISTARIL) 25 MG tablet Take 25 mg by mouth 3 (three) times daily as needed for anxiety.   Yes [provider]  lamoTRIgine (LAMICTAL) 150 MG tablet Take 150 mg by mouth at bedtime.   Yes [provider]  Multiple Vitamin (MULTIVITAMIN WITH MINERALS) TABS tablet Take 1 tablet by mouth daily.   Yes [provider]  traZODone (DESYREL) 100 MG tablet Take 100 mg by mouth at bedtime.   Yes [provider]   Dg Hand Complete Right    Result Date: 09/22/2018 CLINICAL DATA:  Right hand pain, swelling, redness, and warm to touch. Patient was bitten by cat 2 days ago. EXAM: RIGHT HAND - COMPLETE 3+ VIEW COMPARISON:  None. FINDINGS: Dorsal soft tissue swelling over the right hand. No radiopaque soft tissue foreign bodies or gas collections identified. Bones appear intact. No evidence of acute fracture or dislocation. No focal bone sclerosis or bone lesion. Joint spaces are preserved. IMPRESSION: Soft tissue swelling. No acute bony abnormalities. Electronically Signed   By: William  Stevens M.D.   On: 09/22/2018 00:30    Positive ROS: All other systems have been reviewed and were otherwise negative with the exception of those mentioned in the HPI and as above.  Objective: Labs cbc Recent Labs    09/21/18 2117  09/22/18 0328  WBC 12.1* 11.8*  HGB 13.1 12.7  HCT 40.6 37.0  PLT 267 260    Labs inflam No results for input(s): CRP in the last 72 hours.  Invalid input(s): ESR  Labs coag No results for input(s): INR, PTT in the last 72 hours.  Invalid input(s): PT  Recent Labs    09/21/18 2117 09/22/18 0328  NA 134* 139  K 3.8 3.7  CL 103 107  CO2 19* 23  GLUCOSE 112* 108*  BUN 16 16  CREATININE 0.87 0.94  CALCIUM 9.2 9.0    Physical Exam: Vitals:   09/22/18 0024 09/22/18 0302  BP: 118/89 124/80  Pulse: 85 84  Resp: 18 18  Temp:  98.6 F (37 C)  SpO2: 98% 98%   General: Alert, no acute distress.  Upright in bed.  Calm, conversant.  Husband, Will at bedside. Mental status: Alert and Oriented x3 Neurologic: Speech Clear and organized, no gross focal findings or movement disorder appreciated. Respiratory: No cyanosis, no use of accessory musculature Cardiovascular: No pedal edema GI: Abdomen is soft and non-tender, non-distended. Skin: Warm and dry.  Extremities: Warm and well perfused w/o edema Psychiatric: Patient is competent for consent with normal mood and affect  MUSCULOSKELETAL:  RUE: Right hand with significant dorsal swelling and erythema.  There are several puncture wounds about the dorsal aspect of her hand.  Sensation is intact throughout.  Pain with active and passive motion and limited grip.  Discomfort most significant about the ring finger.  She does have some pain palmar side. Other extremities are atraumatic with painless ROM and NVI.  Assessment / Plan: Principal Problem:   Infected cat bite of hand, right, initial encounter Active Problems:   Depression with anxiety   Right dorsal hand cat bite, infection Will obtain MRI to evaluate for tendon involvement/fluid collection. N.p.o. Plan for I&D today Continue antibiotic therapy  The risks benefits and alternatives of the procedure were discussed with the patient including but not limited to  infection, bleeding, nerve injury, the need for revision surgery, blood clots, cardiopulmonary complications, morbidity, mortality, among others.  The patient verbalizes understanding and wishes to proceed.     Contact information:  Timothy Murphy MD, Henry Martensen PA-C  Henry Calvin Martensen III PA-C 09/22/2018 7:58 AM  

## 2018-09-22 NOTE — Interval H&P Note (Signed)
I participated in the care of this patient and agree with the above history, physical and evaluation. I performed a review of the history and a physical exam as detailed   Kianna Billet Daniel Kendal Ghazarian MD  

## 2018-09-22 NOTE — Discharge Instructions (Signed)
Right hand cat bite / infection: Elevate Right hand at all times to reduce pain / swelling.   Do not bear weight with right hand. Use sling for your comfort. Take antibiotics as prescribed. Call to schedule an appointment for follow up in the office to remove drain on Wednesday, 09/24/18.

## 2018-09-22 NOTE — Progress Notes (Signed)
Pt seen and examined.  No charge (pt admitted after midnight last night).  Her hand remains swollen and painful, no draining pus.  Seen by ortho, NPO, MRI ordered, planning to go to OR later today for I&D.   Kelly Splinter MD [facility] 09/22/2018, 1:52 PM

## 2018-09-22 NOTE — ED Notes (Signed)
Right hand swollen and red with small puncture areas.

## 2018-09-22 NOTE — Anesthesia Preprocedure Evaluation (Signed)
Anesthesia Evaluation  Patient identified by MRN, date of birth, ID band Patient awake    Reviewed: Allergy & Precautions, H&P , NPO status , Patient's Chart, lab work & pertinent test results  Airway Mallampati: II  TM Distance: >3 FB Neck ROM: Full    Dental no notable dental hx. (+) Teeth Intact, Dental Advisory Given   Pulmonary Current Smoker,    Pulmonary exam normal breath sounds clear to auscultation       Cardiovascular negative cardio ROS   Rhythm:Regular Rate:Normal     Neuro/Psych Anxiety Depression negative neurological ROS     GI/Hepatic negative GI ROS, Neg liver ROS,   Endo/Other  negative endocrine ROS  Renal/GU negative Renal ROS  negative genitourinary   Musculoskeletal   Abdominal   Peds  Hematology negative hematology ROS (+)   Anesthesia Other Findings   Reproductive/Obstetrics negative OB ROS                             Anesthesia Physical Anesthesia Plan  ASA: II  Anesthesia Plan: General   Post-op Pain Management:    Induction: Intravenous  PONV Risk Score and Plan: 3 and Ondansetron, Dexamethasone and Midazolam  Airway Management Planned: LMA  Additional Equipment:   Intra-op Plan:   Post-operative Plan: Extubation in OR  Informed Consent: I have reviewed the patients History and Physical, chart, labs and discussed the procedure including the risks, benefits and alternatives for the proposed anesthesia with the patient or authorized representative who has indicated his/her understanding and acceptance.     Dental advisory given  Plan Discussed with: CRNA  Anesthesia Plan Comments:         Anesthesia Quick Evaluation

## 2018-09-22 NOTE — H&P (Signed)
History and Physical    Audrey Becker ZOX:[id number] DOB: [birth year] DOA: 09/21/2018  PCP: No primary care provider on file.   Patient coming from: Home   Chief Complaint: Right hand swelling, redness, and pain after cat bite   HPI: Audrey Becker is a 48 y.o. female with medical history significant for anxiety with depression, and breast cancer status post mastectomy, chemotherapy, and radiation, now presenting to the emergency department for evaluation of pain, swelling, and redness of the right hand after she was bit by her cat 3 days ago.  Patient reports that she was in her usual state of health when she was taking her cat to the vet for suspected ingestion, the cat bit her on the right hand, and she has since developed progressive redness, swelling, and pain.  There has not been any drainage.  She is having difficulty opening and closing the hand secondary to pain and swelling.  She was having chills at home.  She does not have any underlying prosthesis.  She has not been on antibiotics recently.   ED Course: Upon arrival to the ED, patient is found to be afebrile, saturating well on room air, and with vitals otherwise normal.  Plain radiographs of the right hand demonstrate soft tissue swelling without acute bony abnormality.  Chemistry panel is notable for slight hyponatremia and CBC features a mild leukocytosis.  Lactic acid is reassuringly normal.  Hand surgery was consulted by the ED physician and recommended a medical admission.  Patient was given empiric dose of Unasyn in the ED.  She remained stable and will be observed for further evaluation and management.  Review of Systems:  All other systems reviewed and apart from HPI, are negative.  Past Medical History:  Diagnosis Date  . Anxiety   . Depression     Past Surgical History:  Procedure Laterality Date  . BREAST SURGERY       has no history on file for tobacco, alcohol, and drug.  No Known Allergies  History reviewed. No  pertinent family history.   Prior to Admission medications   Medication Sig Start Date End Date Taking? Authorizing Provider  diclofenac sodium (VOLTAREN) 1 % GEL Apply 2 g topically 4 (four) times daily as needed (pain).   Yes [provider]  DULoxetine (CYMBALTA) 60 MG capsule Take 60 mg by mouth 2 (two) times daily.   Yes [provider]  hydroxypropyl methylcellulose / hypromellose (ISOPTO TEARS / GONIOVISC) 2.5 % ophthalmic solution Place 1 drop into both eyes 3 (three) times daily as needed for dry eyes.   Yes [provider]  hydrOXYzine (ATARAX/VISTARIL) 25 MG tablet Take 25 mg by mouth 3 (three) times daily as needed for anxiety.   Yes [provider]  lamoTRIgine (LAMICTAL) 150 MG tablet Take 150 mg by mouth at bedtime.   Yes [provider]  Multiple Vitamin (MULTIVITAMIN WITH MINERALS) TABS tablet Take 1 tablet by mouth daily.   Yes [provider]  traZODone (DESYREL) 100 MG tablet Take 100 mg by mouth at bedtime.   Yes [provider]    Physical Exam: Vitals:   09/21/18 2118 09/22/18 0024  BP: 129/82 118/89  Pulse: 87 85  Resp: 18 18  Temp: 98.4 F (36.9 C)   TempSrc: Oral   SpO2: 96% 98%    Constitutional: NAD, calm  Eyes: PERTLA, lids and conjunctivae normal ENMT: Mucous membranes are moist. Posterior pharynx clear of any exudate or lesions.   Neck: normal, supple,  no masses, no thyromegaly Respiratory: clear to auscultation bilaterally, no wheezing, no crackles. Normal respiratory effort.  Cardiovascular: S1 & S2 heard, regular rate and rhythm. No extremity edema (aside from right hand).   Abdomen: No distension, no tenderness, soft. Bowel sounds normal.  Musculoskeletal: no clubbing / cyanosis. No joint deformity upper and lower extremities.  .  Skin: Swelling, erythema, and tenderness with right hand with limited ROM, small puncture wounds at dorsal aspect, no drainage. Warm, dry,  well-perfused. Neurologic: CN 2-12 grossly intact. Sensation intact. Strength 5/5 in all 4 limbs.  Psychiatric: Alert and oriented x 3. Pleasant and cooperative.     Labs on Admission: I have personally reviewed following labs and imaging studies  CBC: Recent Labs  Lab 09/21/18 2117  WBC 12.1*  NEUTROABS 8.4*  HGB 13.1  HCT 40.6  MCV 93.3  PLT 962   Basic Metabolic Panel: Recent Labs  Lab 09/21/18 2117  NA 134*  K 3.8  CL 103  CO2 19*  GLUCOSE 112*  BUN 16  CREATININE 0.87  CALCIUM 9.2   GFR: CrCl cannot be calculated (Unknown ideal weight.). Liver Function Tests: Recent Labs  Lab 09/21/18 2117  AST 22  ALT 18  ALKPHOS 69  BILITOT 0.4  PROT 7.0  ALBUMIN 3.9   No results for input(s): LIPASE, AMYLASE in the last 168 hours. No results for input(s): AMMONIA in the last 168 hours. Coagulation Profile: No results for input(s): INR, PROTIME in the last 168 hours. Cardiac Enzymes: No results for input(s): CKTOTAL, CKMB, CKMBINDEX, TROPONINI in the last 168 hours. BNP (last 3 results) No results for input(s): PROBNP in the last 8760 hours. HbA1C: No results for input(s): HGBA1C in the last 72 hours. CBG: No results for input(s): GLUCAP in the last 168 hours. Lipid Profile: No results for input(s): CHOL, HDL, LDLCALC, TRIG, CHOLHDL, LDLDIRECT in the last 72 hours. Thyroid Function Tests: No results for input(s): TSH, T4TOTAL, FREET4, T3FREE, THYROIDAB in the last 72 hours. Anemia Panel: No results for input(s): VITAMINB12, FOLATE, FERRITIN, TIBC, IRON, RETICCTPCT in the last 72 hours. Urine analysis: No results found for: COLORURINE, APPEARANCEUR, LABSPEC, PHURINE, GLUCOSEU, HGBUR, BILIRUBINUR, KETONESUR, PROTEINUR, UROBILINOGEN, NITRITE, LEUKOCYTESUR Sepsis Labs: @LABRCNTIP (procalcitonin:4,lacticidven:4) )No results found for this or any previous visit (from the past 240 hour(s)).   Radiological Exams on Admission: Dg Hand Complete Right  Result Date:  09/22/2018 CLINICAL DATA:  Right hand pain, swelling, redness, and warm to touch. Patient was bitten by cat 2 days ago. EXAM: RIGHT HAND - COMPLETE 3+ VIEW COMPARISON:  None. FINDINGS: Dorsal soft tissue swelling over the right hand. No radiopaque soft tissue foreign bodies or gas collections identified. Bones appear intact. No evidence of acute fracture or dislocation. No focal bone sclerosis or bone lesion. Joint spaces are preserved. IMPRESSION: Soft tissue swelling. No acute bony abnormalities. Electronically Signed   By: Lucienne Capers M.D.   On: 09/22/2018 00:30    EKG: Not performed.   Assessment/Plan   1. Infected cat bite, right hand - Presents with progressive pain, swelling, erythema, and loss of ROM involving right hand after she was bitten by her cat 3 days ago  - She reports subjective fevers at home, afebrile here, leukocytosis noted, normal lactate  - She was treated with Bactrim and Unasyn in ED  - Hand surgery is consulting and much appreciated  - Continue Unasyn, continue supportive care    2. Depression with anxiety  - Continue Lamictal, Cymbalta, trazodone, and prn Vistaril  DVT prophylaxis: SCD's  Code Status: Full  Family Communication: Husband updated at bedside Consults called: Hand surgery  Admission status: Observation     Vianne Bulls, MD [facility] Pager [phone]  If 7PM-7AM, please contact night-coverage [email or web address] Password [email or web address]  09/22/2018, 2:19 AM

## 2018-09-22 NOTE — Progress Notes (Signed)
RN verified the presence of a signed informed consent that matches stated procedure by patient. Verified armband matches patient's stated name and birth date. Verified NPO status and that all jewelry, contact, glasses, dentures, and partials had been removed (if applicable).   Spoke to Dr. Percell Miller regarding IV. Per Dr. Percell Miller ok to place IV in right [facility].

## 2018-09-23 ENCOUNTER — Encounter (HOSPITAL_COMMUNITY): Payer: Self-pay | Admitting: Orthopedic Surgery

## 2018-09-23 DIAGNOSIS — L03113 Cellulitis of right upper limb: Secondary | ICD-10-CM

## 2018-09-23 DIAGNOSIS — L02511 Cutaneous abscess of right hand: Secondary | ICD-10-CM

## 2018-09-23 DIAGNOSIS — L089 Local infection of the skin and subcutaneous tissue, unspecified: Secondary | ICD-10-CM

## 2018-09-23 DIAGNOSIS — F418 Other specified anxiety disorders: Secondary | ICD-10-CM

## 2018-09-23 DIAGNOSIS — S61451A Open bite of right hand, initial encounter: Secondary | ICD-10-CM

## 2018-09-23 MED ORDER — HYDROCODONE-ACETAMINOPHEN 5-325 MG PO TABS: 1.0000 | ORAL_TABLET | ORAL | 0 refills | Status: AC | PRN

## 2018-09-23 MED ORDER — HYDROCODONE-ACETAMINOPHEN 5-325 MG PO TABS: 1.0000 | ORAL_TABLET | Freq: Three times a day (TID) | ORAL | 0 refills | Status: DC | PRN

## 2018-09-23 MED ORDER — NICOTINE 21 MG/24HR TD PT24
21.0000 mg | MEDICATED_PATCH | Freq: Every day | TRANSDERMAL | Status: DC
  Administered 2018-09-23: 21 mg via TRANSDERMAL
  Filled 2018-09-23: qty 1

## 2018-09-23 MED ORDER — AMOXICILLIN-POT CLAVULANATE 875-125 MG PO TABS
1.0000 | ORAL_TABLET | Freq: Two times a day (BID) | ORAL | Status: DC
  Administered 2018-09-23: 1 via ORAL
  Filled 2018-09-23: qty 1

## 2018-09-23 MED ORDER — AMOXICILLIN-POT CLAVULANATE 875-125 MG PO TABS: 1.0000 | ORAL_TABLET | Freq: Two times a day (BID) | ORAL | 0 refills | Status: DC

## 2018-09-23 MED ORDER — AMOXICILLIN-POT CLAVULANATE 875-125 MG PO TABS: 1.0000 | ORAL_TABLET | Freq: Two times a day (BID) | ORAL | 0 refills | Status: AC

## 2018-09-23 NOTE — Anesthesia Postprocedure Evaluation (Signed)
Anesthesia Post Note  Patient: Saylor Murry  Procedure(s) Performed: IRRIGATION AND DEBRIDEMENT RIGHT DORSAL HAND (Right )     Patient location during evaluation: PACU Anesthesia Type: General Level of consciousness: awake and alert Pain management: pain level controlled Vital Signs Assessment: post-procedure vital signs reviewed and stable Respiratory status: spontaneous breathing, nonlabored ventilation and respiratory function stable Cardiovascular status: blood pressure returned to baseline and stable Postop Assessment: no apparent nausea or vomiting Anesthetic complications: no    Last Vitals:  Vitals:   09/22/18 2120 09/23/18 0250  BP: 125/73 140/66  Pulse: 93 (!) 58  Resp: 17 18  Temp: 36.7 C 36.8 C  SpO2: 96% 100%    Last Pain:  Vitals:   09/23/18 0250  TempSrc: Oral  PainSc:                  Maor Meckel,W. EDMOND

## 2018-09-23 NOTE — Plan of Care (Signed)
Adequate for discharge.

## 2018-09-23 NOTE — Op Note (Signed)
09/22/2018  7:29 AM  PATIENT:  Audrey Becker    PRE-OPERATIVE DIAGNOSIS:  infected right hand  POST-OPERATIVE DIAGNOSIS:  Same  PROCEDURE:  IRRIGATION AND DEBRIDEMENT RIGHT DORSAL HAND  SURGEON:  Renette Butters, MD  ASSISTANT: Roxan Hockey, PA-C, he was present and scrubbed throughout the case, critical for completion in a timely fashion, and for retraction, instrumentation, and closure.   ANESTHESIA:   gen  PREOPERATIVE INDICATIONS:  [name] is a  48 y.o. female with a diagnosis of infected right hand who failed conservative measures and elected for surgical management.    The risks benefits and alternatives were discussed with the patient preoperatively including but not limited to the risks of infection, bleeding, nerve injury, cardiopulmonary complications, the need for revision surgery, among others, and the patient was willing to proceed.  OPERATIVE IMPLANTS: none  OPERATIVE FINDINGS: serous/min purulence  BLOOD LOSS: min  COMPLICATIONS: none  TOURNIQUET TIME: 44min  OPERATIVE PROCEDURE:  Patient was identified in the preoperative holding area and site was marked by me She was transported to the operating theater and placed on the table in supine position taking care to pad all bony prominences. After a preincinduction time out anesthesia was induced. The right upper extremity was prepped and draped in normal sterile fashion and a pre-incision timeout was performed. She received ancef for preoperative antibiotics.   After reviewing her MRI I had discussed with her making 2 dorsal incisions over multiple pockets of purulence on the dorsum of her hand.  I made an incision over the second and fourth metacarpals.  I expressed a large amount of serous fluid some mild purulence probed over the MCP joints both 2 and 4 is MRI demonstrated purulence here or fluid here.  I debrided any necrotic tissue using a rondure and pickup.  I irrigated with 3 L of saline  I then  placed a Penrose drain and closed her incisions with loose nylon stitches.  Sterile dressing was applied she was awoken and taken to the PACU in stable condition  POST OPERATIVE PLAN: Mobilize for DVT prophylaxis continue p.o. antibiotics follow-up in 2 days for drain removal

## 2018-09-23 NOTE — Progress Notes (Signed)
    Subjective: Patient reports pain as mild and significantly improved.    Objective:   VITALS:   Vitals:   09/22/18 1832 09/22/18 2120 09/23/18 0250 09/23/18 0641  BP: 127/76 125/73 140/66 139/67  Pulse: 83 93 (!) 58 77  Resp: 16 17 18 18   Temp: 98.5 F (36.9 C) 98.1 F (36.7 C) 98.2 F (36.8 C) 98 F (36.7 C)  TempSrc: Oral Oral Oral Oral  SpO2: 91% 96% 100% 97%  Weight:      Height:       CBC Latest Ref Rng & Units 09/22/2018 09/21/2018  WBC 4.0 - 10.5 K/uL 11.8(H) 12.1(H)  Hemoglobin 12.0 - 15.0 g/dL 12.7 13.1  Hematocrit 36.0 - 46.0 % 37.0 40.6  Platelets 150 - 400 K/uL 260 267   BMP Latest Ref Rng & Units 09/22/2018 09/21/2018  Glucose 70 - 99 mg/dL 108(H) 112(H)  BUN 6 - 20 mg/dL 16 16  Creatinine 0.44 - 1.00 mg/dL 0.94 0.87  Sodium 135 - 145 mmol/L 139 134(L)  Potassium 3.5 - 5.1 mmol/L 3.7 3.8  Chloride 98 - 111 mmol/L 107 103  CO2 22 - 32 mmol/L 23 19(L)  Calcium 8.9 - 10.3 mg/dL 9.0 9.2   Intake/Output      02/03 0701 - 02/04 0700 02/04 0701 - 02/05 0700   P.O. 120    I.V. (mL/kg) 807.7 (13.3)    IV Piggyback 297.4    Total Intake(mL/kg) 1225 (20.1)    Blood 5    Total Output 5    Net +1220         Urine Occurrence 2 x       Physical Exam: General: NAD.  Upright in bed.  Calm, conversant. MSK RUE: Dressings clean dry and intact.  Wiggling fingers and tolerating passive movement without significant discomfort.  Sensation intact.  Assessment: 1 Day Post-Op  S/P Procedure(s) (LRB): IRRIGATION AND DEBRIDEMENT RIGHT DORSAL HAND (Right) by Dr. Ernesta Amble. Percell Miller on 09/22/2018  Principal Problem:   Infected cat bite of hand, right, initial encounter Active Problems:   Depression with anxiety   Right hand cat bite, infection status post I&D, drain placement Doing well postop day 1 Pain significantly improved No fever or systemic symptoms  Plan: Okay for discharge and transition to p.o. antibiotics when ready medically.  Incentive  Spirometry Elevate at all times to reduce pain and swelling Maintain dressings until follow-up. Okay for some active and passive movement of fingers, but NWB for now. Tylenol and NSAIDs as needed for pain.  Norco for breakthrough.  Follow-up: Plan for patient to return to [facility] 09/24/2018 for drain removal.  Contact information:  Edmonia Lynch MD, Roxan Hockey PA-C   Clinton III, PA-C 09/23/2018, 7:02 AM

## 2018-09-23 NOTE — Discharge Summary (Signed)
Physician Discharge Summary  Patient ID: Audrey Becker MRN: [id number] DOB/AGE: 1971-03-18 48 y.o.  Admit date: 09/21/2018 Discharge date: 09/23/2018  Admission Diagnoses:   Infected cat bite of hand, right, initial encounter   Depression with anxiety  Discharge Diagnoses:    Infected cat bite of R hand   Abscess(es) of R hand sp I&D   Depression with anxiety   Discharged Condition: good  Presentation Summary: Audrey Becker is a 48 y.o. female with medical history significant for anxiety with depression, and breast cancer status post mastectomy, chemotherapy, and radiation, now presenting to the emergency department for evaluation of pain, swelling, and redness of the right hand after she was bit by her cat 3 days ago.  Patient reports that she was in her usual state of health when she was taking her cat to the vet for suspected ingestion, the cat bit her on the right hand, and she has since developed progressive redness, swelling, and pain.  There has not been any drainage.  She is having difficulty opening and closing the hand secondary to pain and swelling.  She was having chills at home.  She does not have any underlying prosthesis.  She has not been on antibiotics recently.  ED Course: Upon arrival to the ED, patient is found to be afebrile, saturating well on room air, and with vitals otherwise normal.  Plain radiographs of the right hand demonstrate soft tissue swelling without acute bony abnormality.  Chemistry panel is notable for slight hyponatremia and CBC features a mild leukocytosis.  Lactic acid is reassuringly normal.  Hand surgery was consulted by the ED physician and recommended a medical admission.  Patient was given empiric dose of Unasyn in the ED.  She remained stable and will be observed for further evaluation and management.  Hospital Course:  Infected cat bite, right hand - Presents with progressive pain, swelling, erythema, and loss of ROM involving right hand after she was  bitten by her cat 3 days prior. +subjective fevers at home. She was treated with Bactrim and Unasyn in ED and admitted. She was seen by Ortho hand surgery here and they took the patient to OR on 09/22/18 for I&D of several small abscess collections on the back of the hand that were shown by MRI.  A drain was left in.  Pain and swelling improved the next day per the pt and Ortho service said ok for dc, f/u on 2/5 for drain removal in the office.  DC on po abx. - will dc on Augmentin 875/125 take 1 po bid for 7 days (10 days total IV + po) - f/u w/ the hand surgeon as per the AVS instructions - prn Norco # 20   Depression with anxiety  - resume Lamictal, Cymbalta, trazodone, and prn Vistaril     Consults: Orthopedic hand surgeon    Treatments: IV Unasyn 2/2 - 09/23/18 IV Ancef 2gm on 2/3 Po Bactrim 1 dose on 2/2  Discharge Exam: Blood pressure 139/67, pulse 77, temperature 98 F (36.7 C), temperature source Oral, resp. rate 18, height 5\' 4"  (1.626 m), weight 60.8 kg, SpO2 97 %. Constitutional: NAD, calm  Eyes: PERTLA, lids and conjunctivae normal ENMT: Mucous membranes are moist. Neck: normal, supple, no masses, no thyromegaly Respiratory: clear to auscultation bilaterally, no wheezing, no crackles.  Cardiovascular: S1 & S2 heard, regular rate and rhythm. Abdomen: No distension, no tenderness, soft. Bowel sounds normal.  Musculoskeletal: no clubbing / cyanosis. No joint deformity upper and lower extremities.  Marland Kitchen  Skin: R hand wrapped, good perfusion of fingertips. Drain in place.  Warm, dry, well-perfused. Neurologic: CN 2-12 grossly intact. Sensation intact. Strength 5/5 in all 4  Disposition: Discharge disposition: 01-Home or Self Care        Allergies as of 09/23/2018   No Known Allergies     Medication List    TAKE these medications   amoxicillin-clavulanate 875-125 MG tablet Commonly known as:  AUGMENTIN Take 1 tablet by mouth every 12 (twelve) hours for 7 days.    diclofenac sodium 1 % Gel Commonly known as:  VOLTAREN Apply 2 g topically 4 (four) times daily as needed (pain).   DULoxetine 60 MG capsule Commonly known as:  CYMBALTA Take 60 mg by mouth 2 (two) times daily.   HYDROcodone-acetaminophen 5-325 MG tablet Commonly known as:  NORCO/VICODIN Take 1-2 tablets by mouth every 4 (four) hours as needed for up to 5 days for moderate pain.   hydroxypropyl methylcellulose / hypromellose 2.5 % ophthalmic solution Commonly known as:  ISOPTO TEARS / GONIOVISC Place 1 drop into both eyes 3 (three) times daily as needed for dry eyes.   hydrOXYzine 25 MG tablet Commonly known as:  ATARAX/VISTARIL Take 25 mg by mouth 3 (three) times daily as needed for anxiety.   lamoTRIgine 150 MG tablet Commonly known as:  LAMICTAL Take 150 mg by mouth at bedtime.   multivitamin with minerals Tabs tablet Take 1 tablet by mouth daily.   traZODone 100 MG tablet Commonly known as:  DESYREL Take 100 mg by mouth at bedtime.      Follow-up Information    Renette Butters, MD. Schedule an appointment as soon as possible for a visit on 09/24/2018.   Specialty:  Orthopedic Surgery Contact information: [address] [place] [address] [phone]           Signed: Sol Blazing 09/23/2018, 12:15 PM

## 2018-09-23 NOTE — Care Management Note (Signed)
Case Management Note  Patient Details  Name: Audrey Becker MRN: [id number] Date of Birth: 1971-03-29  Subjective/Objective:                    Action/Plan: Pt discharging home with self care. Pt without PcP listed. CM provided number for [facility] so patient can find PCP in her area.  Pt has transportation home.  Expected Discharge Date:  09/23/18               Expected Discharge Plan:  Home/Self Care  In-House Referral:     Discharge planning Services  CM Consult  Post Acute Care Choice:    Choice offered to:     DME Arranged:    DME Agency:     HH Arranged:    HH Agency:     Status of Service:  Completed, signed off  If discussed at [employer] of Stay Meetings, dates discussed:    Additional Comments:  Pollie Friar, RN 09/23/2018, 1:04 PM

## 2018-09-27 LAB — AEROBIC/ANAEROBIC CULTURE W GRAM STAIN (SURGICAL/DEEP WOUND)

## 2018-12-30 ENCOUNTER — Telehealth: Payer: Self-pay | Admitting: *Deleted

## 2018-12-30 ENCOUNTER — Other Ambulatory Visit: Payer: Self-pay

## 2018-12-30 ENCOUNTER — Encounter: Payer: Self-pay | Admitting: Infectious Diseases

## 2018-12-30 ENCOUNTER — Ambulatory Visit (INDEPENDENT_AMBULATORY_CARE_PROVIDER_SITE_OTHER): Payer: Medicare Other | Admitting: Infectious Diseases

## 2018-12-30 VITALS — BP 128/87 | HR 88 | Temp 98.6°F | Wt 145.0 lb

## 2018-12-30 DIAGNOSIS — M00841 Arthritis due to other bacteria, right hand: Secondary | ICD-10-CM | POA: Diagnosis present

## 2018-12-30 DIAGNOSIS — A280 Pasteurellosis: Secondary | ICD-10-CM | POA: Diagnosis not present

## 2018-12-30 MED ORDER — SODIUM CHLORIDE 0.9 % IV SOLN: 1.0000 g | INTRAVENOUS | 0 refills | Status: AC

## 2018-12-30 NOTE — Telephone Encounter (Signed)
Called [facility] to have the patient PICC appointment made. Had to leave a message for them to call me when they are available to schedule.  Patient also needs to be set up for daily dose at Short Stay as insurance will not pay for home dosing.

## 2018-12-30 NOTE — Patient Instructions (Addendum)
PICC to be placed to RT arm for treatment of RT hand septic arthritis. Will start ertapenem 1 gm IV daily for 4 weeks via same day surgery/infusion  Once PICC has been placed.

## 2018-12-30 NOTE — Progress Notes (Signed)
Subjective:    Patient ID: Audrey Becker, female    DOB: 1971/01/22, 48 y.o.   MRN: [id number]  HPI The patient is a pleasant 48 year old white female with a remote history of breast cancer presenting today for an evaluation for possible right hand septic joint infection.  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  The following 24 hours she developed significant redness pain and erythema to her right index finger and ultimately required an I&D performed by Dr. Percell Miller on September 22, 2018.  Operative cultures subsequently grew Pasteurella multocida.  A preoperative MRI performed on February third 2020 showed findings consistent with cellulitis of the dorsum of the hand with slight tendinitis of the flexor and extensor tendons as well as to tiny superficial abscesses in the soft tissue of the dorsum of the hand overlying the second and fourth MCP joints.  She received a three-week course of oral Augmentin with initial improvement, but several weeks later again developing worsening right index finger joint pain and mild erythema with paresthesias to her affected joint.  She denies any fevers or chills but instead reports that she "just has not improved" like she hoped she would.  Regarding the patient's malignancy history, she was diagnosed with stage IIIb breast cancer in 2006 and underwent a left-sided mastectomy in that same year as well as XRT and chemotherapy.  She did develop 1 recurrence requiring additional chemotherapy and XRT and completed treatment in 2009.  She has received the majority of her care with an oncologist in [place] who she sees every 6 months to 1 year.  Her most recent PET scan was 2 years ago and at that time did not show any evidence to suggest recurrence of her malignancy.  Past Medical History:  Diagnosis Date   Anxiety    Cancer ([facility])    stage IIIB LT breast CA, dx'ed in 2006, s/p mastectomy, XRT,  and chemo   Cat bite 09/21/2018   Depression    History reviewed. No pertinent family history. She denies any CA, CAD, renal dz, or mental illness in her family but does have several family members with DM, HTN, and OA .  Social History   Tobacco Use   Smoking status: Current Every Day Smoker    Packs/day: 0.50    Years: 15.00    Pack years: 7.50    Types: Cigarettes   Smokeless tobacco: Never Used  Substance Use Topics   Alcohol use: Yes    Comment: 1-2 glasses of wine/mo.    Drug use: Never    Review of Systems  Constitutional: Positive for chills and fatigue. Negative for fever.  HENT: Negative for congestion, hearing loss and sinus pressure.   Eyes: Negative for photophobia, discharge, redness and visual disturbance.  Respiratory: Negative for apnea, cough, shortness of breath and wheezing.   Cardiovascular: Negative for chest pain and leg swelling.  Gastrointestinal: Negative for abdominal distention, abdominal pain, constipation, diarrhea, nausea and vomiting.  Endocrine: Negative for cold intolerance, heat intolerance, polydipsia and polyuria.  Genitourinary: Negative for dysuria, flank pain, frequency, urgency, vaginal bleeding and vaginal discharge.  Musculoskeletal: Positive for arthralgias and joint swelling. Negative for back pain and neck pain.       RT hand  Skin: Negative for pallor and rash.  Allergic/Immunologic: Negative for immunocompromised state.  Neurological: Negative for dizziness, seizures, speech difficulty, weakness and headaches.  Hematological: Does not bruise/bleed  easily.  Psychiatric/Behavioral: Negative for agitation, confusion, hallucinations and sleep disturbance. The patient is not nervous/anxious.    Vitals:   12/30/18 1001  BP: 128/87  Pulse: 88  Temp: 98.6 F (37 C)      Objective:   Physical Exam Gen: pleasant, NAD, A&Ox 3 Head: NCAT, no temporal wasting evident EENT: PERRL, EOMI, MMM, adequate dentition Neck: supple, no  JVD CV: NRRR, no murmurs evident Pulm: CTA bilaterally, no wheeze or retractions Abd: soft, NTND, +BS Extrems: trace LE edema, 2+ pulses MSK: difficulty making a fully closed RT fist, small effusion noted along her RT index finger MCP joint w/o overlying erythema or drainage, other joints to RT hand are WNL, ROM to RT wrist is WNL Skin: no rashes, adequate skin turgor Neuro: CN II-XII grossly intact, no focal neurologic deficits appreciated, gait was normal, A&Ox 3  Lab Results  Component Value Date   WBC 5.9 02/03/2019   HGB 12.5 02/03/2019   HCT 38.0 02/03/2019   MCV 89.8 02/03/2019   PLT 231 02/03/2019     Chemistry      Component Value Date/Time   NA 140 02/03/2019 0913   K 4.0 02/03/2019 0913   CL 107 02/03/2019 0913   CO2 25 02/03/2019 0913   BUN 13 02/03/2019 0913   CREATININE 1.00 02/03/2019 0913   CREATININE 1.00 12/30/2018 1138      Component Value Date/Time   CALCIUM 9.3 02/03/2019 0913   ALKPHOS 69 09/21/2018 2117   AST 22 09/21/2018 2117   ALT 18 09/21/2018 2117   BILITOT 0.4 09/21/2018 2117         Assessment & Plan:  Patient is a 48 year old white female with remote history of breast cancer recent right second MCP septic joint infection following recent cat bite.  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  Although she underwent an I&D to her hand in February and received 3 weeks of oral antibiotics postoperatively, this likely was an adequate to treat a true joint infection that would have treated her tenosynovitis/soft tissue abscess.  Since completing treatment, she has continued to have articular findings particularly to her right index finger second MCP joint.  Given the pathogen involved, at this time I would advise the patient have a PICC line placed for the intention that she received a 4-week course of IV antibiotics for best treatment.  As the patient's  insurance will not cover IV antibiotics from home, our office has made arrangements for her to receive ertapenem 1 g IV daily for 4 weeks in total at an outpatient infusion center for weekdays and with ER visits on weekends to ensure she has no treatment interruption.  We will check baseline CBC with differential, BMP, and CRP today as well as weekly while she remains on IV antibiotics.  Greater than 30 minutes of time was spent in the office today coordinating her IV infusions alone, see orders below.  Tobacco abuse-patient was advised to stop smoking cigarettes at least for the next 6 months to assist with wound healing and recovery of her joint function.  I spoke at length with the patient regarding the vasoconstrictive properties of nicotine and how this contributes to poor wound healing as well as joint remodeling.  She expressed understanding and agreed to work on stopping her cigarette habit.   OPAT ORDERS:  Diagnosis: RT hand pyogenic arthritis  Culture Result: Pasteurella  No Known Allergies  Discharge antibiotics: ertapenem 1 gm IV daily Per pharmacy protocol   Duration: 4 weeks End Date: Tentatively by 01/30/2019. Check with ID provider prior to d/c'ing PICC.  [facility] and Maintenance Per Protocol __ Please pull PIC at completion of IV antibiotics _X_ Please leave PIC in place until doctor has seen patient or been notified  Labs weekly while on IV antibiotics: _X_ CBC with differential _X_ BMP __ BMP TWICE WEEKLY** __ CMP _X_ CRP __ ESR __ Vancomycin trough  Fax weekly labs to [phone]  Clinic Follow Up Appt: 4 weeks  @ [facility] with Dr. Prince Rome

## 2018-12-31 ENCOUNTER — Other Ambulatory Visit (HOSPITAL_COMMUNITY): Payer: Self-pay | Admitting: *Deleted

## 2018-12-31 LAB — BASIC METABOLIC PANEL
BUN: 16 mg/dL (ref 7–25)
CO2: 26 mmol/L (ref 20–32)
Calcium: 9.9 mg/dL (ref 8.6–10.2)
Chloride: 104 mmol/L (ref 98–110)
Creat: 1 mg/dL (ref 0.50–1.10)
Glucose, Bld: 76 mg/dL (ref 65–99)
Potassium: 5 mmol/L (ref 3.5–5.3)
Sodium: 139 mmol/L (ref 135–146)

## 2018-12-31 LAB — CBC WITH DIFFERENTIAL/PLATELET
Absolute Monocytes: 510 cells/uL (ref 200–950)
Basophils Absolute: 50 cells/uL (ref 0–200)
Basophils Relative: 0.8 %
Eosinophils Absolute: 328 cells/uL (ref 15–500)
Eosinophils Relative: 5.2 %
HCT: 41.5 % (ref 35.0–45.0)
Hemoglobin: 14 g/dL (ref 11.7–15.5)
Lymphs Abs: 2111 cells/uL (ref 850–3900)
MCH: 30.9 pg (ref 27.0–33.0)
MCHC: 33.7 g/dL (ref 32.0–36.0)
MCV: 91.6 fL (ref 80.0–100.0)
MPV: 8.9 fL (ref 7.5–12.5)
Monocytes Relative: 8.1 %
Neutro Abs: 3301 cells/uL (ref 1500–7800)
Neutrophils Relative %: 52.4 %
Platelets: 279 10*3/uL (ref 140–400)
RBC: 4.53 10*6/uL (ref 3.80–5.10)
RDW: 13 % (ref 11.0–15.0)
Total Lymphocyte: 33.5 %
WBC: 6.3 10*3/uL (ref 3.8–10.8)

## 2018-12-31 LAB — C-REACTIVE PROTEIN: CRP: 6.5 mg/L (ref <8.0)

## 2018-12-31 NOTE — Telephone Encounter (Signed)
Patient is set up to have PICC placed 01/01/19 at 12 noon. She is to have her first dose at short stay and her daily doses at short stay there after. Sat and Sunday doses will be explained to her also. Information faxed to Short Stay and patient notified.

## 2019-01-01 ENCOUNTER — Encounter (HOSPITAL_COMMUNITY)
Admission: RE | Admit: 2019-01-01 | Discharge: 2019-01-01 | Disposition: A | Discharge status: Home / Self Care | Payer: Medicare Other | Source: Ambulatory Visit | Attending: Infectious Diseases | Admitting: Infectious Diseases

## 2019-01-01 ENCOUNTER — Other Ambulatory Visit: Payer: Self-pay | Admitting: Infectious Diseases

## 2019-01-01 ENCOUNTER — Other Ambulatory Visit: Payer: Self-pay

## 2019-01-01 ENCOUNTER — Ambulatory Visit (HOSPITAL_COMMUNITY)
Admission: RE | Admit: 2019-01-01 | Discharge: 2019-01-01 | Disposition: A | Discharge status: Home / Self Care | Payer: Medicare Other | Source: Ambulatory Visit | Attending: Infectious Diseases | Admitting: Infectious Diseases

## 2019-01-01 DIAGNOSIS — M00841 Arthritis due to other bacteria, right hand: Secondary | ICD-10-CM

## 2019-01-01 MED ORDER — SODIUM CHLORIDE 0.9% FLUSH: 10.0000 mL | INTRAVENOUS | Status: DC | PRN

## 2019-01-01 MED ORDER — LIDOCAINE HCL 1 % IJ SOLN
INTRAMUSCULAR | Status: AC
  Filled 2019-01-01: qty 20

## 2019-01-01 MED ORDER — HEPARIN SOD (PORK) LOCK FLUSH 100 UNIT/ML IV SOLN
250.0000 [IU] | INTRAVENOUS | Status: DC | PRN
  Administered 2019-01-01: 250 [IU]

## 2019-01-01 MED ORDER — SODIUM CHLORIDE 0.9 % IV SOLN
1.0000 g | INTRAVENOUS | Status: DC
  Administered 2019-01-01: 1000 mg via INTRAVENOUS
  Filled 2019-01-01: qty 1

## 2019-01-01 MED ORDER — LIDOCAINE HCL (PF) 1 % IJ SOLN
INTRAMUSCULAR | Status: DC | PRN
  Administered 2019-01-01: 5 mL

## 2019-01-01 NOTE — Procedures (Signed)
Successful placement of single lumen PICC line to right basilic vein. Length 37 cm Tip at lower SVC/RA PICC capped No complications Ready for use.  EBL < 5 mL   Ascencion Dike PA-C 01/01/2019 12:37 PM

## 2019-01-02 ENCOUNTER — Encounter (HOSPITAL_COMMUNITY)
Admission: RE | Admit: 2019-01-02 | Discharge: 2019-01-02 | Disposition: A | Discharge status: Home / Self Care | Payer: Medicare Other | Source: Ambulatory Visit | Attending: Infectious Diseases | Admitting: Infectious Diseases

## 2019-01-02 DIAGNOSIS — M00841 Arthritis due to other bacteria, right hand: Secondary | ICD-10-CM | POA: Diagnosis not present

## 2019-01-02 MED ORDER — HEPARIN SOD (PORK) LOCK FLUSH 100 UNIT/ML IV SOLN
INTRAVENOUS | Status: AC
  Filled 2019-01-02: qty 5

## 2019-01-02 MED ORDER — SODIUM CHLORIDE 0.9 % IV SOLN
1.0000 g | INTRAVENOUS | Status: DC
  Administered 2019-01-02: 1000 mg via INTRAVENOUS
  Filled 2019-01-02: qty 1

## 2019-01-02 MED ORDER — HEPARIN SOD (PORK) LOCK FLUSH 100 UNIT/ML IV SOLN
250.0000 [IU] | INTRAVENOUS | Status: DC | PRN
  Administered 2019-01-02: 250 [IU]

## 2019-01-03 ENCOUNTER — Other Ambulatory Visit: Payer: Self-pay

## 2019-01-03 ENCOUNTER — Encounter (HOSPITAL_COMMUNITY)
Admission: RE | Admit: 2019-01-03 | Discharge: 2019-01-03 | Disposition: A | Discharge status: Home / Self Care | Payer: Medicare Other | Source: Ambulatory Visit | Attending: Infectious Diseases | Admitting: Infectious Diseases

## 2019-01-03 DIAGNOSIS — M00841 Arthritis due to other bacteria, right hand: Secondary | ICD-10-CM | POA: Diagnosis not present

## 2019-01-03 MED ORDER — SODIUM CHLORIDE 0.9 % IV SOLN
1.0000 g | INTRAVENOUS | Status: DC
  Administered 2019-01-03: 1000 mg via INTRAVENOUS
  Filled 2019-01-03: qty 1

## 2019-01-03 MED ORDER — HEPARIN SOD (PORK) LOCK FLUSH 100 UNIT/ML IV SOLN
250.0000 [IU] | INTRAVENOUS | Status: AC | PRN
  Administered 2019-01-03: 250 [IU]

## 2019-01-04 ENCOUNTER — Encounter (HOSPITAL_COMMUNITY)
Admission: RE | Admit: 2019-01-04 | Discharge: 2019-01-04 | Disposition: A | Discharge status: Home / Self Care | Payer: Medicare Other | Source: Ambulatory Visit | Attending: Infectious Diseases | Admitting: Infectious Diseases

## 2019-01-04 DIAGNOSIS — M00841 Arthritis due to other bacteria, right hand: Secondary | ICD-10-CM | POA: Diagnosis not present

## 2019-01-04 MED ORDER — HEPARIN SOD (PORK) LOCK FLUSH 100 UNIT/ML IV SOLN
250.0000 [IU] | INTRAVENOUS | Status: AC | PRN
  Administered 2019-01-04: 250 [IU]

## 2019-01-04 MED ORDER — SODIUM CHLORIDE 0.9 % IV SOLN
1.0000 g | INTRAVENOUS | Status: DC
  Administered 2019-01-04: 1000 mg via INTRAVENOUS
  Filled 2019-01-04: qty 1

## 2019-01-05 ENCOUNTER — Encounter (HOSPITAL_COMMUNITY)
Admission: RE | Admit: 2019-01-05 | Discharge: 2019-01-05 | Disposition: A | Discharge status: Home / Self Care | Payer: Medicare Other | Source: Ambulatory Visit | Attending: Infectious Diseases | Admitting: Infectious Diseases

## 2019-01-05 ENCOUNTER — Other Ambulatory Visit: Payer: Self-pay

## 2019-01-05 DIAGNOSIS — M00841 Arthritis due to other bacteria, right hand: Secondary | ICD-10-CM | POA: Diagnosis not present

## 2019-01-05 MED ORDER — HEPARIN SOD (PORK) LOCK FLUSH 100 UNIT/ML IV SOLN
INTRAVENOUS | Status: AC
  Administered 2019-01-05: 250 [IU]
  Filled 2019-01-05: qty 5

## 2019-01-05 MED ORDER — HEPARIN SOD (PORK) LOCK FLUSH 100 UNIT/ML IV SOLN
250.0000 [IU] | INTRAVENOUS | Status: DC | PRN
  Administered 2019-01-05: 11:00:00 250 [IU]

## 2019-01-05 MED ORDER — SODIUM CHLORIDE 0.9 % IV SOLN
1.0000 g | INTRAVENOUS | Status: DC
  Administered 2019-01-05: 1000 mg via INTRAVENOUS
  Filled 2019-01-05: qty 1

## 2019-01-06 ENCOUNTER — Encounter (HOSPITAL_COMMUNITY)
Admission: RE | Admit: 2019-01-06 | Discharge: 2019-01-06 | Disposition: A | Discharge status: Home / Self Care | Payer: Medicare Other | Source: Ambulatory Visit | Attending: Infectious Diseases | Admitting: Infectious Diseases

## 2019-01-06 DIAGNOSIS — M00841 Arthritis due to other bacteria, right hand: Secondary | ICD-10-CM | POA: Diagnosis not present

## 2019-01-06 LAB — BASIC METABOLIC PANEL
Anion gap: 8 (ref 5–15)
BUN: 15 mg/dL (ref 6–20)
CO2: 26 mmol/L (ref 22–32)
Calcium: 9.6 mg/dL (ref 8.9–10.3)
Chloride: 104 mmol/L (ref 98–111)
Creatinine, Ser: 0.92 mg/dL (ref 0.44–1.00)
GFR calc Af Amer: >60 mL/min (ref >60)
GFR calc non Af Amer: >60 mL/min (ref >60)
Glucose, Bld: 106 mg/dL — ABNORMAL HIGH (ref 70–99)
Potassium: 4.4 mmol/L (ref 3.5–5.1)
Sodium: 138 mmol/L (ref 135–145)

## 2019-01-06 LAB — CBC WITH DIFFERENTIAL/PLATELET
Abs Immature Granulocytes: 0.03 10*3/uL (ref 0.00–0.07)
Basophils Absolute: 0.1 10*3/uL (ref 0.0–0.1)
Basophils Relative: 1 %
Eosinophils Absolute: 0.4 10*3/uL (ref 0.0–0.5)
Eosinophils Relative: 5 %
HCT: 42 % (ref 36.0–46.0)
Hemoglobin: 13.9 g/dL (ref 12.0–15.0)
Immature Granulocytes: 0 %
Lymphocytes Relative: 29 %
Lymphs Abs: 2.4 10*3/uL (ref 0.7–4.0)
MCH: 30.1 pg (ref 26.0–34.0)
MCHC: 33.1 g/dL (ref 30.0–36.0)
MCV: 90.9 fL (ref 80.0–100.0)
Monocytes Absolute: 0.6 10*3/uL (ref 0.1–1.0)
Monocytes Relative: 7 %
Neutro Abs: 4.6 10*3/uL (ref 1.7–7.7)
Neutrophils Relative %: 58 %
Platelets: 230 10*3/uL (ref 150–400)
RBC: 4.62 MIL/uL (ref 3.87–5.11)
RDW: 12.8 % (ref 11.5–15.5)
WBC: 8 10*3/uL (ref 4.0–10.5)
nRBC: 0 % (ref 0.0–0.2)

## 2019-01-06 LAB — C-REACTIVE PROTEIN: CRP: <0.8 mg/dL (ref <1.0)

## 2019-01-06 MED ORDER — HEPARIN SOD (PORK) LOCK FLUSH 100 UNIT/ML IV SOLN
INTRAVENOUS | Status: AC
  Administered 2019-01-06: 250 [IU]
  Filled 2019-01-06: qty 5

## 2019-01-06 MED ORDER — SODIUM CHLORIDE 0.9 % IV SOLN
1.0000 g | INTRAVENOUS | Status: DC
  Administered 2019-01-06: 1000 mg via INTRAVENOUS
  Filled 2019-01-06: qty 1

## 2019-01-06 MED ORDER — HEPARIN SOD (PORK) LOCK FLUSH 100 UNIT/ML IV SOLN
250.0000 [IU] | INTRAVENOUS | Status: DC | PRN
  Administered 2019-01-06: 12:00:00 250 [IU]

## 2019-01-07 ENCOUNTER — Other Ambulatory Visit: Payer: Self-pay

## 2019-01-07 ENCOUNTER — Encounter (HOSPITAL_COMMUNITY)
Admission: RE | Admit: 2019-01-07 | Discharge: 2019-01-07 | Disposition: A | Discharge status: Home / Self Care | Payer: Medicare Other | Source: Ambulatory Visit | Attending: Infectious Diseases | Admitting: Infectious Diseases

## 2019-01-07 DIAGNOSIS — M00841 Arthritis due to other bacteria, right hand: Secondary | ICD-10-CM | POA: Diagnosis not present

## 2019-01-07 MED ORDER — HEPARIN SOD (PORK) LOCK FLUSH 100 UNIT/ML IV SOLN
250.0000 [IU] | INTRAVENOUS | Status: DC | PRN
  Administered 2019-01-07: 250 [IU]

## 2019-01-07 MED ORDER — SODIUM CHLORIDE 0.9 % IV SOLN
1.0000 g | INTRAVENOUS | Status: DC
  Administered 2019-01-07: 1000 mg via INTRAVENOUS
  Filled 2019-01-07: qty 1

## 2019-01-07 MED ORDER — HEPARIN SOD (PORK) LOCK FLUSH 100 UNIT/ML IV SOLN
INTRAVENOUS | Status: AC
  Filled 2019-01-07: qty 5

## 2019-01-08 ENCOUNTER — Encounter (HOSPITAL_COMMUNITY)
Admission: RE | Admit: 2019-01-08 | Discharge: 2019-01-08 | Disposition: A | Discharge status: Home / Self Care | Payer: Medicare Other | Source: Ambulatory Visit | Attending: Infectious Diseases | Admitting: Infectious Diseases

## 2019-01-08 ENCOUNTER — Other Ambulatory Visit: Payer: Self-pay

## 2019-01-08 DIAGNOSIS — M00841 Arthritis due to other bacteria, right hand: Secondary | ICD-10-CM | POA: Diagnosis not present

## 2019-01-08 MED ORDER — HEPARIN SOD (PORK) LOCK FLUSH 100 UNIT/ML IV SOLN
INTRAVENOUS | Status: AC
  Administered 2019-01-08: 250 [IU]
  Filled 2019-01-08: qty 5

## 2019-01-08 MED ORDER — SODIUM CHLORIDE 0.9 % IV SOLN
1.0000 g | INTRAVENOUS | Status: DC
  Administered 2019-01-08: 1000 mg via INTRAVENOUS
  Filled 2019-01-08: qty 1

## 2019-01-08 MED ORDER — HEPARIN SOD (PORK) LOCK FLUSH 100 UNIT/ML IV SOLN
250.0000 [IU] | INTRAVENOUS | Status: DC | PRN
  Administered 2019-01-08: 11:00:00 250 [IU]

## 2019-01-09 ENCOUNTER — Encounter (HOSPITAL_COMMUNITY)
Admission: RE | Admit: 2019-01-09 | Discharge: 2019-01-09 | Disposition: A | Discharge status: Home / Self Care | Payer: Medicare Other | Source: Ambulatory Visit | Attending: Infectious Diseases | Admitting: Infectious Diseases

## 2019-01-09 ENCOUNTER — Other Ambulatory Visit: Payer: Self-pay

## 2019-01-09 DIAGNOSIS — M00841 Arthritis due to other bacteria, right hand: Secondary | ICD-10-CM | POA: Diagnosis not present

## 2019-01-09 MED ORDER — HEPARIN SOD (PORK) LOCK FLUSH 100 UNIT/ML IV SOLN
INTRAVENOUS | Status: AC
  Administered 2019-01-09: 250 [IU]
  Filled 2019-01-09: qty 5

## 2019-01-09 MED ORDER — HEPARIN SOD (PORK) LOCK FLUSH 100 UNIT/ML IV SOLN
250.0000 [IU] | INTRAVENOUS | Status: DC | PRN
  Administered 2019-01-09: 11:00:00 250 [IU]

## 2019-01-09 MED ORDER — SODIUM CHLORIDE 0.9 % IV SOLN
1.0000 g | INTRAVENOUS | Status: DC
  Administered 2019-01-09: 1000 mg via INTRAVENOUS
  Filled 2019-01-09: qty 1

## 2019-01-10 ENCOUNTER — Encounter (HOSPITAL_COMMUNITY)
Admission: RE | Admit: 2019-01-10 | Discharge: 2019-01-10 | Disposition: A | Discharge status: Home / Self Care | Payer: Medicare Other | Source: Ambulatory Visit | Attending: Infectious Diseases | Admitting: Infectious Diseases

## 2019-01-10 DIAGNOSIS — M00841 Arthritis due to other bacteria, right hand: Secondary | ICD-10-CM | POA: Diagnosis not present

## 2019-01-10 MED ORDER — HEPARIN SOD (PORK) LOCK FLUSH 100 UNIT/ML IV SOLN
250.0000 [IU] | INTRAVENOUS | Status: DC | PRN
  Administered 2019-01-10: 250 [IU]

## 2019-01-10 MED ORDER — SODIUM CHLORIDE 0.9 % IV SOLN
1.0000 g | INTRAVENOUS | Status: DC
  Administered 2019-01-10: 1000 mg via INTRAVENOUS
  Filled 2019-01-10: qty 1

## 2019-01-11 ENCOUNTER — Encounter (HOSPITAL_COMMUNITY)
Admission: RE | Admit: 2019-01-11 | Discharge: 2019-01-11 | Disposition: A | Discharge status: Home / Self Care | Payer: Medicare Other | Source: Ambulatory Visit | Attending: Infectious Diseases | Admitting: Infectious Diseases

## 2019-01-11 DIAGNOSIS — M00841 Arthritis due to other bacteria, right hand: Secondary | ICD-10-CM | POA: Diagnosis not present

## 2019-01-11 MED ORDER — HEPARIN SOD (PORK) LOCK FLUSH 100 UNIT/ML IV SOLN
250.0000 [IU] | INTRAVENOUS | Status: DC | PRN
  Administered 2019-01-11: 250 [IU]

## 2019-01-11 MED ORDER — SODIUM CHLORIDE 0.9 % IV SOLN
1.0000 g | INTRAVENOUS | Status: DC
  Administered 2019-01-11: 1000 mg via INTRAVENOUS
  Filled 2019-01-11 (×2): qty 1

## 2019-01-12 ENCOUNTER — Emergency Department (HOSPITAL_COMMUNITY)
Admission: EM | Admit: 2019-01-12 | Discharge: 2019-01-12 | Disposition: A | Discharge status: Home / Self Care | Payer: Medicare Other | Attending: Emergency Medicine | Admitting: Emergency Medicine

## 2019-01-12 ENCOUNTER — Encounter (HOSPITAL_COMMUNITY): Payer: Self-pay | Admitting: Emergency Medicine

## 2019-01-12 ENCOUNTER — Other Ambulatory Visit: Payer: Self-pay

## 2019-01-12 ENCOUNTER — Encounter (HOSPITAL_COMMUNITY): Payer: Medicare Other

## 2019-01-12 DIAGNOSIS — F17200 Nicotine dependence, unspecified, uncomplicated: Secondary | ICD-10-CM | POA: Insufficient documentation

## 2019-01-12 DIAGNOSIS — Z79899 Other long term (current) drug therapy: Secondary | ICD-10-CM | POA: Insufficient documentation

## 2019-01-12 DIAGNOSIS — Z853 Personal history of malignant neoplasm of breast: Secondary | ICD-10-CM | POA: Insufficient documentation

## 2019-01-12 DIAGNOSIS — M009 Pyogenic arthritis, unspecified: Secondary | ICD-10-CM | POA: Insufficient documentation

## 2019-01-12 MED ORDER — SODIUM CHLORIDE 0.9 % IV SOLN
1.0000 g | Freq: Once | INTRAVENOUS | Status: AC
  Administered 2019-01-12: 11:00:00 1000 mg via INTRAVENOUS
  Filled 2019-01-12: qty 1

## 2019-01-12 MED ORDER — HEPARIN SOD (PORK) LOCK FLUSH 100 UNIT/ML IV SOLN
500.0000 [IU] | Freq: Once | INTRAVENOUS | Status: AC
  Administered 2019-01-12: 12:00:00 500 [IU]
  Filled 2019-01-12: qty 5

## 2019-01-12 NOTE — ED Provider Notes (Signed)
[facility] EMERGENCY DEPARTMENT Provider Note   CSN: [id number] Arrival date & time: 01/12/19  1031  History   Chief Complaint Chief Complaint  Patient presents with  . IV therapy    HPI Audrey Becker is a 48 y.o. female.     HPI    48 year old female presents today for IV antibiotics.  Patient was admitted and discharged on 09/23/2018 with cellulitis and pyogenic arthritis in her right hand.  Culture grew out Pasteurella she was started on a four-week course of IV ertapenem.  Patient has a right upper extremity PICC line.  She notes due to the holiday she is unable to get her antibiotics and is here for antibiotic administration.  She denies any symptoms of infection, denies any fevers.  No other complaints.   Past Medical History:  Diagnosis Date  . Anxiety   . Cancer ([facility])    BREAST CANCER  . Cat bite 09/21/2018  . Depression     Patient Active Problem List   Diagnosis Date Noted  . Cellulitis of right upper extremity   . Infected cat bite of hand, right, initial encounter 09/22/2018  . History of bilateral breast cancer 09/22/2018  . Depression with anxiety 09/22/2018  . Cat bite     Past Surgical History:  Procedure Laterality Date  . BREAST SURGERY    . I&D EXTREMITY Right 09/22/2018   Procedure: IRRIGATION AND DEBRIDEMENT RIGHT DORSAL HAND;  Surgeon: Renette Butters, MD;  Location: [facility];  Service: Orthopedics;  Laterality: Right;     OB History   No obstetric history on file.      Home Medications    Prior to Admission medications   Medication Sig Start Date End Date Taking? Authorizing Provider  diclofenac sodium (VOLTAREN) 1 % GEL Apply 2 g topically 4 (four) times daily as needed (pain).    [provider]  DULoxetine (CYMBALTA) 60 MG capsule Take 60 mg by mouth 2 (two) times daily.    [provider]  ertapenem 1,000 mg in sodium chloride 0.9 % 100 mL Inject 1,000 mg into the vein daily. 12/30/18   Powers, Evern Core, MD  hydroxypropyl methylcellulose / hypromellose (ISOPTO TEARS / GONIOVISC) 2.5 % ophthalmic solution Place 1 drop into both eyes 3 (three) times daily as needed for dry eyes.    [provider]  hydrOXYzine (ATARAX/VISTARIL) 25 MG tablet Take 25 mg by mouth 3 (three) times daily as needed for anxiety.    [provider]  lamoTRIgine (LAMICTAL) 150 MG tablet Take 150 mg by mouth at bedtime.    [provider]  Multiple Vitamin (MULTIVITAMIN WITH MINERALS) TABS tablet Take 1 tablet by mouth daily.    [provider]  traZODone (DESYREL) 100 MG tablet Take 100 mg by mouth at bedtime.    [provider]    Family History No family history on file.  Social History Social History   Tobacco Use  . Smoking status: Current Every Day Smoker    Types: Cigarettes  . Smokeless tobacco: Never Used  Substance Use Topics  . Alcohol use: Yes  . Drug use: Never     Allergies   Patient has no known allergies.   Review of Systems Review of Systems  All other systems reviewed and are negative.    Physical Exam Updated Vital Signs BP 115/73   Pulse 76   Temp 98.3 F (36.8 C) (Oral)   Resp 20   Wt 68 kg  SpO2 99%   BMI 25.73 kg/m   Physical Exam Vitals signs and nursing note reviewed.  Constitutional:      Appearance: She is well-developed.  HENT:     Head: Normocephalic and atraumatic.  Eyes:     General: No scleral icterus.       Right eye: No discharge.        Left eye: No discharge.     Conjunctiva/sclera: Conjunctivae normal.     Pupils: Pupils are equal, round, and reactive to light.  Neck:     Musculoskeletal: Normal range of motion.     Vascular: No JVD.     Trachea: No tracheal deviation.  Pulmonary:     Effort: Pulmonary effort is normal.     Breath sounds: No stridor.  Musculoskeletal:     Comments: Right hand wrist without swelling edema   Neurological:     Mental Status: She is alert and oriented to person,  place, and time.     Coordination: Coordination normal.  Psychiatric:        Behavior: Behavior normal.        Thought Content: Thought content normal.        Judgment: Judgment normal.      ED Treatments / Results  Labs (all labs ordered are listed, but only abnormal results are displayed) Labs Reviewed - No data to display  EKG None  Radiology No results found.  Procedures Procedures (including critical care time)  Medications Ordered in ED Medications  ertapenem (INVANZ) 1,000 mg in sodium chloride 0.9 % 100 mL IVPB (0 g Intravenous Stopped 01/12/19 1212)  heparin lock flush 100 unit/mL (500 Units Intracatheter Given 01/12/19 1213)     Initial Impression / Assessment and Plan / ED Course  I have reviewed the triage vital signs and the nursing notes.  Pertinent labs & imaging results that were available during my care of the patient were reviewed by me and considered in my medical decision making (see chart for details).        48 year old female for IV antibiotics.  She will be given a dose of ertapenem and discharged with continued outpatient follow-up.  Return precautions given.  Final Clinical Impressions(s) / ED Diagnoses   Final diagnoses:  Pyogenic arthritis of right hand, due to unspecified organism [facility])    ED Discharge Orders    None       Francee Gentile 01/12/19 1518    Quintella Reichert, MD 01/12/19 [phone]

## 2019-01-12 NOTE — ED Notes (Signed)
Patient verbalizes understanding of discharge instructions . Opportunity for questions and answers were provided . Armband removed by staff ,Pt discharged from ED. W/C  offered at D/C  and Declined W/C at D/C and was escorted to lobby by RN.  

## 2019-01-12 NOTE — Discharge Instructions (Signed)
Please read attached information. If you experience any new or worsening signs or symptoms please return to the emergency room for evaluation. Please follow-up with your primary care provider or specialist as discussed.  °

## 2019-01-12 NOTE — ED Triage Notes (Signed)
Pt in for IV abx trx - 21 day cycle. States the IV team normally infuses, but does not on weekends/holidays.

## 2019-01-13 ENCOUNTER — Other Ambulatory Visit: Payer: Self-pay

## 2019-01-13 ENCOUNTER — Ambulatory Visit (HOSPITAL_COMMUNITY)
Admission: RE | Admit: 2019-01-13 | Discharge: 2019-01-13 | Disposition: A | Discharge status: Home / Self Care | Payer: Medicare Other | Source: Ambulatory Visit | Attending: Infectious Diseases | Admitting: Infectious Diseases

## 2019-01-13 DIAGNOSIS — M00841 Arthritis due to other bacteria, right hand: Secondary | ICD-10-CM | POA: Diagnosis present

## 2019-01-13 LAB — BASIC METABOLIC PANEL
Anion gap: 11 (ref 5–15)
BUN: 17 mg/dL (ref 6–20)
CO2: 26 mmol/L (ref 22–32)
Calcium: 9.9 mg/dL (ref 8.9–10.3)
Chloride: 103 mmol/L (ref 98–111)
Creatinine, Ser: 0.9 mg/dL (ref 0.44–1.00)
GFR calc Af Amer: >60 mL/min (ref >60)
GFR calc non Af Amer: >60 mL/min (ref >60)
Glucose, Bld: 96 mg/dL (ref 70–99)
Potassium: 4.3 mmol/L (ref 3.5–5.1)
Sodium: 140 mmol/L (ref 135–145)

## 2019-01-13 LAB — CBC WITH DIFFERENTIAL/PLATELET
Abs Immature Granulocytes: 0.01 10*3/uL (ref 0.00–0.07)
Basophils Absolute: 0 10*3/uL (ref 0.0–0.1)
Basophils Relative: 1 %
Eosinophils Absolute: 0.3 10*3/uL (ref 0.0–0.5)
Eosinophils Relative: 5 %
HCT: 40 % (ref 36.0–46.0)
Hemoglobin: 13.5 g/dL (ref 12.0–15.0)
Immature Granulocytes: 0 %
Lymphocytes Relative: 26 %
Lymphs Abs: 1.6 10*3/uL (ref 0.7–4.0)
MCH: 30.6 pg (ref 26.0–34.0)
MCHC: 33.8 g/dL (ref 30.0–36.0)
MCV: 90.7 fL (ref 80.0–100.0)
Monocytes Absolute: 0.5 10*3/uL (ref 0.1–1.0)
Monocytes Relative: 8 %
Neutro Abs: 3.6 10*3/uL (ref 1.7–7.7)
Neutrophils Relative %: 60 %
Platelets: 228 10*3/uL (ref 150–400)
RBC: 4.41 MIL/uL (ref 3.87–5.11)
RDW: 12.7 % (ref 11.5–15.5)
WBC: 6 10*3/uL (ref 4.0–10.5)
nRBC: 0 % (ref 0.0–0.2)

## 2019-01-13 LAB — C-REACTIVE PROTEIN: CRP: <0.8 mg/dL (ref <1.0)

## 2019-01-13 MED ORDER — HEPARIN SOD (PORK) LOCK FLUSH 100 UNIT/ML IV SOLN
INTRAVENOUS | Status: AC
  Filled 2019-01-13: qty 5

## 2019-01-13 MED ORDER — HEPARIN SOD (PORK) LOCK FLUSH 100 UNIT/ML IV SOLN
250.0000 [IU] | INTRAVENOUS | Status: DC | PRN
  Administered 2019-01-13: 250 [IU]

## 2019-01-13 MED ORDER — SODIUM CHLORIDE 0.9 % IV SOLN
1.0000 g | INTRAVENOUS | Status: DC
  Administered 2019-01-13: 10:00:00 1000 mg via INTRAVENOUS
  Filled 2019-01-13: qty 1

## 2019-01-14 ENCOUNTER — Other Ambulatory Visit: Payer: Self-pay

## 2019-01-14 ENCOUNTER — Encounter (HOSPITAL_COMMUNITY)
Admission: RE | Admit: 2019-01-14 | Discharge: 2019-01-14 | Disposition: A | Discharge status: Home / Self Care | Payer: Medicare Other | Source: Ambulatory Visit | Attending: Infectious Diseases | Admitting: Infectious Diseases

## 2019-01-14 DIAGNOSIS — M00841 Arthritis due to other bacteria, right hand: Secondary | ICD-10-CM | POA: Diagnosis not present

## 2019-01-14 MED ORDER — SODIUM CHLORIDE 0.9 % IV SOLN
1.0000 g | INTRAVENOUS | Status: DC
  Administered 2019-01-14: 1000 mg via INTRAVENOUS
  Filled 2019-01-14: qty 1

## 2019-01-14 MED ORDER — HEPARIN SOD (PORK) LOCK FLUSH 100 UNIT/ML IV SOLN
INTRAVENOUS | Status: AC
  Administered 2019-01-14: 250 [IU]
  Filled 2019-01-14: qty 5

## 2019-01-14 MED ORDER — HEPARIN SOD (PORK) LOCK FLUSH 100 UNIT/ML IV SOLN
250.0000 [IU] | INTRAVENOUS | Status: DC | PRN
  Administered 2019-01-14: 11:00:00 250 [IU]

## 2019-01-15 ENCOUNTER — Other Ambulatory Visit (HOSPITAL_COMMUNITY): Payer: Self-pay | Admitting: *Deleted

## 2019-01-15 ENCOUNTER — Encounter (HOSPITAL_COMMUNITY)
Admission: RE | Admit: 2019-01-15 | Discharge: 2019-01-15 | Disposition: A | Discharge status: Home / Self Care | Payer: Medicare Other | Source: Ambulatory Visit | Attending: Infectious Diseases | Admitting: Infectious Diseases

## 2019-01-15 ENCOUNTER — Other Ambulatory Visit: Payer: Self-pay

## 2019-01-15 DIAGNOSIS — M00841 Arthritis due to other bacteria, right hand: Secondary | ICD-10-CM | POA: Diagnosis not present

## 2019-01-15 MED ORDER — SODIUM CHLORIDE 0.9 % IV SOLN
1.0000 g | INTRAVENOUS | Status: DC
  Administered 2019-01-15: 1000 mg via INTRAVENOUS
  Filled 2019-01-15: qty 1

## 2019-01-15 MED ORDER — HEPARIN SOD (PORK) LOCK FLUSH 100 UNIT/ML IV SOLN
INTRAVENOUS | Status: AC
  Administered 2019-01-15: 250 [IU]
  Filled 2019-01-15: qty 5

## 2019-01-15 MED ORDER — HEPARIN SOD (PORK) LOCK FLUSH 100 UNIT/ML IV SOLN
250.0000 [IU] | INTRAVENOUS | Status: AC | PRN
  Administered 2019-01-15: 10:00:00 250 [IU]

## 2019-01-16 ENCOUNTER — Encounter (HOSPITAL_COMMUNITY)
Admission: RE | Admit: 2019-01-16 | Discharge: 2019-01-16 | Disposition: A | Discharge status: Home / Self Care | Payer: Medicare Other | Source: Ambulatory Visit | Attending: Infectious Diseases | Admitting: Infectious Diseases

## 2019-01-16 ENCOUNTER — Other Ambulatory Visit: Payer: Self-pay

## 2019-01-16 DIAGNOSIS — M00841 Arthritis due to other bacteria, right hand: Secondary | ICD-10-CM | POA: Diagnosis not present

## 2019-01-16 MED ORDER — HEPARIN SOD (PORK) LOCK FLUSH 100 UNIT/ML IV SOLN
INTRAVENOUS | Status: AC
  Filled 2019-01-16: qty 5

## 2019-01-16 MED ORDER — SODIUM CHLORIDE 0.9 % IV SOLN
1.0000 g | INTRAVENOUS | Status: DC
  Administered 2019-01-16: 1000 mg via INTRAVENOUS
  Filled 2019-01-16: qty 1

## 2019-01-16 MED ORDER — HEPARIN SOD (PORK) LOCK FLUSH 100 UNIT/ML IV SOLN
500.0000 [IU] | Freq: Once | INTRAVENOUS | Status: DC
  Administered 2019-01-16: 250 [IU] via INTRAVENOUS

## 2019-01-17 ENCOUNTER — Emergency Department (HOSPITAL_COMMUNITY): Admission: EM | Admit: 2019-01-17 | Payer: Medicare Other | Source: Home / Self Care

## 2019-01-17 ENCOUNTER — Ambulatory Visit (HOSPITAL_COMMUNITY)
Admission: RE | Admit: 2019-01-17 | Discharge: 2019-01-17 | Disposition: A | Discharge status: Home / Self Care | Payer: Medicare Other | Source: Ambulatory Visit | Attending: Infectious Diseases | Admitting: Infectious Diseases

## 2019-01-17 ENCOUNTER — Other Ambulatory Visit: Payer: Self-pay

## 2019-01-17 DIAGNOSIS — M009 Pyogenic arthritis, unspecified: Secondary | ICD-10-CM | POA: Insufficient documentation

## 2019-01-17 MED ORDER — SODIUM CHLORIDE 0.9 % IV SOLN
1.0000 g | INTRAVENOUS | Status: DC
  Administered 2019-01-17: 1000 mg via INTRAVENOUS
  Filled 2019-01-17: qty 1

## 2019-01-17 MED ORDER — HEPARIN SOD (PORK) LOCK FLUSH 100 UNIT/ML IV SOLN
500.0000 [IU] | Freq: Once | INTRAVENOUS | Status: AC
  Administered 2019-01-17: 250 [IU] via INTRAVENOUS

## 2019-01-18 ENCOUNTER — Other Ambulatory Visit: Payer: Self-pay

## 2019-01-18 ENCOUNTER — Ambulatory Visit (HOSPITAL_COMMUNITY)
Admission: RE | Admit: 2019-01-18 | Discharge: 2019-01-18 | Disposition: A | Discharge status: Home / Self Care | Payer: Medicare Other | Source: Ambulatory Visit | Attending: Infectious Diseases | Admitting: Infectious Diseases

## 2019-01-18 DIAGNOSIS — M009 Pyogenic arthritis, unspecified: Secondary | ICD-10-CM | POA: Insufficient documentation

## 2019-01-18 MED ORDER — SODIUM CHLORIDE 0.9 % IV SOLN
1.0000 g | INTRAVENOUS | Status: DC
  Administered 2019-01-18: 1000 mg via INTRAVENOUS
  Filled 2019-01-18: qty 1

## 2019-01-18 MED ORDER — HEPARIN SOD (PORK) LOCK FLUSH 100 UNIT/ML IV SOLN
500.0000 [IU] | Freq: Once | INTRAVENOUS | Status: AC
  Administered 2019-01-18: 250 [IU] via INTRAVENOUS

## 2019-01-19 ENCOUNTER — Other Ambulatory Visit: Payer: Self-pay

## 2019-01-19 ENCOUNTER — Ambulatory Visit (HOSPITAL_COMMUNITY)
Admission: RE | Admit: 2019-01-19 | Discharge: 2019-01-19 | Disposition: A | Discharge status: Home / Self Care | Payer: Medicare Other | Source: Ambulatory Visit | Attending: Infectious Diseases | Admitting: Infectious Diseases

## 2019-01-19 DIAGNOSIS — M048 Other autoinflammatory syndromes: Secondary | ICD-10-CM | POA: Insufficient documentation

## 2019-01-19 MED ORDER — HEPARIN SOD (PORK) LOCK FLUSH 100 UNIT/ML IV SOLN
500.0000 [IU] | Freq: Once | INTRAVENOUS | Status: DC
  Administered 2019-01-19: 250 [IU] via INTRAVENOUS

## 2019-01-19 MED ORDER — HEPARIN SOD (PORK) LOCK FLUSH 100 UNIT/ML IV SOLN
INTRAVENOUS | Status: AC
  Administered 2019-01-19: 250 [IU] via INTRAVENOUS
  Filled 2019-01-19: qty 5

## 2019-01-19 MED ORDER — SODIUM CHLORIDE 0.9 % IV SOLN
1.0000 g | INTRAVENOUS | Status: DC
  Administered 2019-01-19: 1000 mg via INTRAVENOUS
  Filled 2019-01-19: qty 1

## 2019-01-20 ENCOUNTER — Other Ambulatory Visit: Payer: Self-pay

## 2019-01-20 ENCOUNTER — Ambulatory Visit (HOSPITAL_COMMUNITY)
Admission: RE | Admit: 2019-01-20 | Discharge: 2019-01-20 | Disposition: A | Discharge status: Home / Self Care | Payer: Medicare Other | Source: Ambulatory Visit | Attending: Infectious Diseases | Admitting: Infectious Diseases

## 2019-01-20 DIAGNOSIS — M048 Other autoinflammatory syndromes: Secondary | ICD-10-CM | POA: Diagnosis present

## 2019-01-20 LAB — CBC WITH DIFFERENTIAL/PLATELET
Abs Immature Granulocytes: 0.02 10*3/uL (ref 0.00–0.07)
Basophils Absolute: 0.1 10*3/uL (ref 0.0–0.1)
Basophils Relative: 1 %
Eosinophils Absolute: 0.4 10*3/uL (ref 0.0–0.5)
Eosinophils Relative: 7 %
HCT: 38.6 % (ref 36.0–46.0)
Hemoglobin: 12.9 g/dL (ref 12.0–15.0)
Immature Granulocytes: 0 %
Lymphocytes Relative: 35 %
Lymphs Abs: 2.2 10*3/uL (ref 0.7–4.0)
MCH: 30.2 pg (ref 26.0–34.0)
MCHC: 33.4 g/dL (ref 30.0–36.0)
MCV: 90.4 fL (ref 80.0–100.0)
Monocytes Absolute: 0.5 10*3/uL (ref 0.1–1.0)
Monocytes Relative: 7 %
Neutro Abs: 3.2 10*3/uL (ref 1.7–7.7)
Neutrophils Relative %: 50 %
Platelets: 265 10*3/uL (ref 150–400)
RBC: 4.27 MIL/uL (ref 3.87–5.11)
RDW: 12.6 % (ref 11.5–15.5)
WBC: 6.4 10*3/uL (ref 4.0–10.5)
nRBC: 0 % (ref 0.0–0.2)

## 2019-01-20 LAB — BASIC METABOLIC PANEL
Anion gap: 8 (ref 5–15)
BUN: 15 mg/dL (ref 6–20)
CO2: 25 mmol/L (ref 22–32)
Calcium: 9.2 mg/dL (ref 8.9–10.3)
Chloride: 106 mmol/L (ref 98–111)
Creatinine, Ser: 0.9 mg/dL (ref 0.44–1.00)
GFR calc Af Amer: >60 mL/min (ref >60)
GFR calc non Af Amer: >60 mL/min (ref >60)
Glucose, Bld: 80 mg/dL (ref 70–99)
Potassium: 4.1 mmol/L (ref 3.5–5.1)
Sodium: 139 mmol/L (ref 135–145)

## 2019-01-20 LAB — C-REACTIVE PROTEIN: CRP: <0.8 mg/dL (ref <1.0)

## 2019-01-20 MED ORDER — SODIUM CHLORIDE 0.9 % IV SOLN
1.0000 g | INTRAVENOUS | Status: DC
  Administered 2019-01-20: 1000 mg via INTRAVENOUS
  Filled 2019-01-20: qty 1

## 2019-01-20 MED ORDER — HEPARIN SOD (PORK) LOCK FLUSH 100 UNIT/ML IV SOLN
INTRAVENOUS | Status: AC
  Administered 2019-01-20: 250 [IU] via INTRAVENOUS
  Filled 2019-01-20: qty 5

## 2019-01-20 MED ORDER — HEPARIN SOD (PORK) LOCK FLUSH 100 UNIT/ML IV SOLN
500.0000 [IU] | Freq: Once | INTRAVENOUS | Status: DC
  Administered 2019-01-20: 250 [IU] via INTRAVENOUS

## 2019-01-21 ENCOUNTER — Other Ambulatory Visit: Payer: Self-pay

## 2019-01-21 ENCOUNTER — Ambulatory Visit (HOSPITAL_COMMUNITY)
Admission: RE | Admit: 2019-01-21 | Discharge: 2019-01-21 | Disposition: A | Discharge status: Home / Self Care | Payer: Medicare Other | Source: Ambulatory Visit | Attending: Infectious Diseases | Admitting: Infectious Diseases

## 2019-01-21 DIAGNOSIS — M048 Other autoinflammatory syndromes: Secondary | ICD-10-CM | POA: Diagnosis present

## 2019-01-21 MED ORDER — HEPARIN SOD (PORK) LOCK FLUSH 100 UNIT/ML IV SOLN: 500.0000 [IU] | Freq: Once | INTRAVENOUS | Status: DC

## 2019-01-21 MED ORDER — HEPARIN SOD (PORK) LOCK FLUSH 100 UNIT/ML IV SOLN
250.0000 [IU] | Freq: Once | INTRAVENOUS | Status: AC
  Administered 2019-01-21: 250 [IU] via INTRAVENOUS

## 2019-01-21 MED ORDER — SODIUM CHLORIDE 0.9 % IV SOLN
1.0000 g | INTRAVENOUS | Status: DC
  Administered 2019-01-21: 1000 mg via INTRAVENOUS
  Filled 2019-01-21: qty 1

## 2019-01-21 MED ORDER — HEPARIN SOD (PORK) LOCK FLUSH 100 UNIT/ML IV SOLN
INTRAVENOUS | Status: AC
  Filled 2019-01-21: qty 5

## 2019-01-22 ENCOUNTER — Ambulatory Visit (HOSPITAL_COMMUNITY)
Admission: RE | Admit: 2019-01-22 | Discharge: 2019-01-22 | Disposition: A | Discharge status: Home / Self Care | Payer: Medicare Other | Source: Ambulatory Visit | Attending: Infectious Diseases | Admitting: Infectious Diseases

## 2019-01-22 ENCOUNTER — Other Ambulatory Visit: Payer: Self-pay

## 2019-01-22 DIAGNOSIS — M048 Other autoinflammatory syndromes: Secondary | ICD-10-CM | POA: Insufficient documentation

## 2019-01-22 MED ORDER — HEPARIN SOD (PORK) LOCK FLUSH 100 UNIT/ML IV SOLN
INTRAVENOUS | Status: AC
  Administered 2019-01-22: 250 [IU] via INTRAVENOUS
  Filled 2019-01-22: qty 5

## 2019-01-22 MED ORDER — SODIUM CHLORIDE 0.9 % IV SOLN
1.0000 g | INTRAVENOUS | Status: DC
  Administered 2019-01-22: 1000 mg via INTRAVENOUS
  Filled 2019-01-22: qty 1

## 2019-01-22 MED ORDER — HEPARIN SOD (PORK) LOCK FLUSH 100 UNIT/ML IV SOLN
250.0000 [IU] | Freq: Once | INTRAVENOUS | Status: DC
  Administered 2019-01-22: 250 [IU] via INTRAVENOUS

## 2019-01-23 ENCOUNTER — Other Ambulatory Visit: Payer: Self-pay

## 2019-01-23 ENCOUNTER — Ambulatory Visit (HOSPITAL_COMMUNITY)
Admission: RE | Admit: 2019-01-23 | Discharge: 2019-01-23 | Disposition: A | Discharge status: Home / Self Care | Payer: Medicare Other | Source: Ambulatory Visit | Attending: Infectious Diseases | Admitting: Infectious Diseases

## 2019-01-23 DIAGNOSIS — M048 Other autoinflammatory syndromes: Secondary | ICD-10-CM | POA: Insufficient documentation

## 2019-01-23 MED ORDER — HEPARIN SOD (PORK) LOCK FLUSH 100 UNIT/ML IV SOLN
250.0000 [IU] | Freq: Once | INTRAVENOUS | Status: DC
  Administered 2019-01-23: 250 [IU] via INTRAVENOUS

## 2019-01-23 MED ORDER — HEPARIN SOD (PORK) LOCK FLUSH 100 UNIT/ML IV SOLN
INTRAVENOUS | Status: AC
  Filled 2019-01-23: qty 5

## 2019-01-23 MED ORDER — SODIUM CHLORIDE 0.9 % IV SOLN
1.0000 g | INTRAVENOUS | Status: DC
  Administered 2019-01-23: 11:00:00 1000 mg via INTRAVENOUS
  Filled 2019-01-23: qty 1

## 2019-01-24 ENCOUNTER — Other Ambulatory Visit: Payer: Self-pay

## 2019-01-24 ENCOUNTER — Ambulatory Visit (HOSPITAL_COMMUNITY)
Admission: RE | Admit: 2019-01-24 | Discharge: 2019-01-24 | Disposition: A | Discharge status: Home / Self Care | Payer: Medicare Other | Source: Ambulatory Visit | Attending: Infectious Diseases | Admitting: Infectious Diseases

## 2019-01-24 DIAGNOSIS — M048 Other autoinflammatory syndromes: Secondary | ICD-10-CM | POA: Diagnosis present

## 2019-01-24 MED ORDER — HEPARIN SOD (PORK) LOCK FLUSH 100 UNIT/ML IV SOLN
250.0000 [IU] | Freq: Once | INTRAVENOUS | Status: AC
  Administered 2019-01-24: 250 [IU] via INTRAVENOUS

## 2019-01-24 MED ORDER — SODIUM CHLORIDE 0.9 % IV SOLN
1.0000 g | INTRAVENOUS | Status: DC
  Administered 2019-01-24: 1000 mg via INTRAVENOUS
  Filled 2019-01-24: qty 1

## 2019-01-25 ENCOUNTER — Ambulatory Visit (HOSPITAL_COMMUNITY)
Admission: RE | Admit: 2019-01-25 | Discharge: 2019-01-25 | Disposition: A | Discharge status: Home / Self Care | Payer: Medicare Other | Source: Ambulatory Visit | Attending: Infectious Diseases | Admitting: Infectious Diseases

## 2019-01-25 ENCOUNTER — Other Ambulatory Visit: Payer: Self-pay

## 2019-01-25 DIAGNOSIS — M048 Other autoinflammatory syndromes: Secondary | ICD-10-CM | POA: Insufficient documentation

## 2019-01-25 MED ORDER — SODIUM CHLORIDE 0.9 % IV SOLN
1.0000 g | INTRAVENOUS | Status: DC
  Administered 2019-01-25: 1000 mg via INTRAVENOUS
  Filled 2019-01-25: qty 1

## 2019-01-25 MED ORDER — HEPARIN SOD (PORK) LOCK FLUSH 100 UNIT/ML IV SOLN
250.0000 [IU] | Freq: Once | INTRAVENOUS | Status: AC
  Administered 2019-01-25: 250 [IU] via INTRAVENOUS

## 2019-01-26 ENCOUNTER — Encounter (HOSPITAL_COMMUNITY)
Admission: RE | Admit: 2019-01-26 | Discharge: 2019-01-26 | Disposition: A | Discharge status: Home / Self Care | Payer: Medicare Other | Source: Ambulatory Visit | Attending: Infectious Diseases | Admitting: Infectious Diseases

## 2019-01-26 ENCOUNTER — Other Ambulatory Visit: Payer: Self-pay

## 2019-01-26 DIAGNOSIS — M009 Pyogenic arthritis, unspecified: Secondary | ICD-10-CM | POA: Diagnosis present

## 2019-01-26 MED ORDER — HEPARIN SOD (PORK) LOCK FLUSH 100 UNIT/ML IV SOLN
INTRAVENOUS | Status: AC
  Filled 2019-01-26: qty 5

## 2019-01-26 MED ORDER — SODIUM CHLORIDE 0.9 % IV SOLN
1.0000 g | INTRAVENOUS | Status: DC
  Administered 2019-01-26: 1000 mg via INTRAVENOUS
  Filled 2019-01-26: qty 1

## 2019-01-26 MED ORDER — HEPARIN SOD (PORK) LOCK FLUSH 100 UNIT/ML IV SOLN
250.0000 [IU] | Freq: Once | INTRAVENOUS | Status: DC
  Administered 2019-01-26: 250 [IU] via INTRAVENOUS

## 2019-01-27 ENCOUNTER — Other Ambulatory Visit: Payer: Self-pay

## 2019-01-27 ENCOUNTER — Encounter (HOSPITAL_COMMUNITY)
Admission: RE | Admit: 2019-01-27 | Discharge: 2019-01-27 | Disposition: A | Discharge status: Home / Self Care | Payer: Medicare Other | Source: Ambulatory Visit | Attending: Infectious Diseases | Admitting: Infectious Diseases

## 2019-01-27 ENCOUNTER — Other Ambulatory Visit (HOSPITAL_COMMUNITY): Payer: Self-pay | Admitting: *Deleted

## 2019-01-27 DIAGNOSIS — M009 Pyogenic arthritis, unspecified: Secondary | ICD-10-CM | POA: Diagnosis not present

## 2019-01-27 LAB — CBC WITH DIFFERENTIAL/PLATELET
Abs Immature Granulocytes: 0.02 10*3/uL (ref 0.00–0.07)
Basophils Absolute: 0.1 10*3/uL (ref 0.0–0.1)
Basophils Relative: 1 %
Eosinophils Absolute: 0.5 10*3/uL (ref 0.0–0.5)
Eosinophils Relative: 7 %
HCT: 40.9 % (ref 36.0–46.0)
Hemoglobin: 13.7 g/dL (ref 12.0–15.0)
Immature Granulocytes: 0 %
Lymphocytes Relative: 26 %
Lymphs Abs: 1.7 10*3/uL (ref 0.7–4.0)
MCH: 30 pg (ref 26.0–34.0)
MCHC: 33.5 g/dL (ref 30.0–36.0)
MCV: 89.7 fL (ref 80.0–100.0)
Monocytes Absolute: 0.6 10*3/uL (ref 0.1–1.0)
Monocytes Relative: 9 %
Neutro Abs: 3.7 10*3/uL (ref 1.7–7.7)
Neutrophils Relative %: 57 %
Platelets: 257 10*3/uL (ref 150–400)
RBC: 4.56 MIL/uL (ref 3.87–5.11)
RDW: 12.6 % (ref 11.5–15.5)
WBC: 6.6 10*3/uL (ref 4.0–10.5)
nRBC: 0 % (ref 0.0–0.2)

## 2019-01-27 LAB — BASIC METABOLIC PANEL
Anion gap: 9 (ref 5–15)
BUN: 19 mg/dL (ref 6–20)
CO2: 25 mmol/L (ref 22–32)
Calcium: 9.8 mg/dL (ref 8.9–10.3)
Chloride: 105 mmol/L (ref 98–111)
Creatinine, Ser: 0.94 mg/dL (ref 0.44–1.00)
GFR calc Af Amer: >60 mL/min (ref >60)
GFR calc non Af Amer: >60 mL/min (ref >60)
Glucose, Bld: 81 mg/dL (ref 70–99)
Potassium: 4.3 mmol/L (ref 3.5–5.1)
Sodium: 139 mmol/L (ref 135–145)

## 2019-01-27 LAB — C-REACTIVE PROTEIN: CRP: <0.8 mg/dL (ref <1.0)

## 2019-01-27 MED ORDER — SODIUM CHLORIDE 0.9 % IV SOLN
1.0000 g | INTRAVENOUS | Status: DC
  Administered 2019-01-27: 1000 mg via INTRAVENOUS
  Filled 2019-01-27: qty 1

## 2019-01-27 MED ORDER — HEPARIN SOD (PORK) LOCK FLUSH 100 UNIT/ML IV SOLN
INTRAVENOUS | Status: AC
  Filled 2019-01-27: qty 5

## 2019-01-27 MED ORDER — HEPARIN SOD (PORK) LOCK FLUSH 100 UNIT/ML IV SOLN: 500.0000 [IU] | Freq: Once | INTRAVENOUS | Status: DC

## 2019-01-27 MED ORDER — HEPARIN SOD (PORK) LOCK FLUSH 100 UNIT/ML IV SOLN
250.0000 [IU] | Freq: Once | INTRAVENOUS | Status: AC
  Administered 2019-01-27: 250 [IU] via INTRAVENOUS

## 2019-01-28 ENCOUNTER — Other Ambulatory Visit: Payer: Self-pay

## 2019-01-28 ENCOUNTER — Telehealth: Payer: Self-pay

## 2019-01-28 ENCOUNTER — Encounter (HOSPITAL_COMMUNITY)
Admission: RE | Admit: 2019-01-28 | Discharge: 2019-01-28 | Disposition: A | Discharge status: Home / Self Care | Payer: Medicare Other | Source: Ambulatory Visit | Attending: Infectious Diseases | Admitting: Infectious Diseases

## 2019-01-28 DIAGNOSIS — M009 Pyogenic arthritis, unspecified: Secondary | ICD-10-CM | POA: Diagnosis not present

## 2019-01-28 MED ORDER — HEPARIN SOD (PORK) LOCK FLUSH 100 UNIT/ML IV SOLN
INTRAVENOUS | Status: AC
  Administered 2019-01-28: 250 [IU] via INTRAVENOUS
  Filled 2019-01-28: qty 5

## 2019-01-28 MED ORDER — HEPARIN SOD (PORK) LOCK FLUSH 100 UNIT/ML IV SOLN
500.0000 [IU] | Freq: Once | INTRAVENOUS | Status: DC
  Administered 2019-01-28: 250 [IU] via INTRAVENOUS

## 2019-01-28 MED ORDER — SODIUM CHLORIDE 0.9 % IV SOLN
1.0000 g | INTRAVENOUS | Status: DC
  Administered 2019-01-28: 1000 mg via INTRAVENOUS
  Filled 2019-01-28: qty 1

## 2019-01-28 NOTE — Telephone Encounter (Signed)
Received a call from [place], [place] at [facility] asking to see if its okay to pull patient's picc after last dose of antibiotics. Patient is currently scheduled to see Dr. Jules Schick on 6/16. Spoke with Dr. Prince Rome who would like to extend patient antibiotics until 6/16. Gave verbal order to RN who will notify patient. RN confirmed order to extend antibiotics and will continue current lab orders. [place]

## 2019-01-29 ENCOUNTER — Other Ambulatory Visit: Payer: Self-pay

## 2019-01-29 ENCOUNTER — Ambulatory Visit (HOSPITAL_COMMUNITY)
Admission: RE | Admit: 2019-01-29 | Discharge: 2019-01-29 | Disposition: A | Discharge status: Home / Self Care | Payer: Medicare Other | Source: Ambulatory Visit | Attending: Infectious Diseases | Admitting: Infectious Diseases

## 2019-01-29 DIAGNOSIS — M009 Pyogenic arthritis, unspecified: Secondary | ICD-10-CM | POA: Diagnosis present

## 2019-01-29 MED ORDER — HEPARIN SOD (PORK) LOCK FLUSH 100 UNIT/ML IV SOLN
INTRAVENOUS | Status: AC
  Filled 2019-01-29: qty 5

## 2019-01-29 MED ORDER — HEPARIN SOD (PORK) LOCK FLUSH 100 UNIT/ML IV SOLN
500.0000 [IU] | Freq: Once | INTRAVENOUS | Status: AC
  Administered 2019-01-29: 250 [IU] via INTRAVENOUS

## 2019-01-29 MED ORDER — SODIUM CHLORIDE 0.9 % IV SOLN
1.0000 g | INTRAVENOUS | Status: DC
  Administered 2019-01-29: 1000 mg via INTRAVENOUS
  Filled 2019-01-29: qty 1

## 2019-01-30 ENCOUNTER — Ambulatory Visit (HOSPITAL_COMMUNITY)
Admission: RE | Admit: 2019-01-30 | Discharge: 2019-01-30 | Disposition: A | Discharge status: Home / Self Care | Payer: Medicare Other | Source: Ambulatory Visit | Attending: Infectious Diseases | Admitting: Infectious Diseases

## 2019-01-30 ENCOUNTER — Other Ambulatory Visit: Payer: Self-pay

## 2019-01-30 DIAGNOSIS — M009 Pyogenic arthritis, unspecified: Secondary | ICD-10-CM | POA: Diagnosis present

## 2019-01-30 MED ORDER — SODIUM CHLORIDE 0.9 % IV SOLN
1.0000 g | INTRAVENOUS | Status: DC
  Administered 2019-01-30: 11:00:00 1000 mg via INTRAVENOUS
  Filled 2019-01-30: qty 1

## 2019-01-30 MED ORDER — HEPARIN SOD (PORK) LOCK FLUSH 100 UNIT/ML IV SOLN
INTRAVENOUS | Status: AC
  Administered 2019-01-30: 250 [IU]
  Filled 2019-01-30: qty 5

## 2019-01-31 ENCOUNTER — Ambulatory Visit (HOSPITAL_COMMUNITY)
Admission: RE | Admit: 2019-01-31 | Discharge: 2019-01-31 | Disposition: A | Discharge status: Home / Self Care | Payer: Medicare Other | Attending: Infectious Diseases | Admitting: Infectious Diseases

## 2019-01-31 ENCOUNTER — Encounter (HOSPITAL_COMMUNITY)
Admission: RE | Admit: 2019-01-31 | Discharge: 2019-01-31 | Disposition: A | Discharge status: Home / Self Care | Payer: Medicare Other | Source: Ambulatory Visit | Attending: Infectious Diseases | Admitting: Infectious Diseases

## 2019-01-31 DIAGNOSIS — M009 Pyogenic arthritis, unspecified: Secondary | ICD-10-CM | POA: Insufficient documentation

## 2019-01-31 MED ORDER — SODIUM CHLORIDE 0.9 % IV SOLN
1.0000 g | INTRAVENOUS | Status: DC
  Administered 2019-01-31: 1000 mg via INTRAVENOUS
  Filled 2019-01-31: qty 1

## 2019-01-31 MED ORDER — HEPARIN SOD (PORK) LOCK FLUSH 100 UNIT/ML IV SOLN
250.0000 [IU] | INTRAVENOUS | Status: AC | PRN
  Administered 2019-01-31: 250 [IU]

## 2019-02-01 ENCOUNTER — Ambulatory Visit (HOSPITAL_COMMUNITY)
Admission: RE | Admit: 2019-02-01 | Discharge: 2019-02-01 | Disposition: A | Discharge status: Home / Self Care | Payer: Medicare Other | Attending: Infectious Diseases | Admitting: Infectious Diseases

## 2019-02-01 ENCOUNTER — Encounter (HOSPITAL_COMMUNITY)
Admission: RE | Admit: 2019-02-01 | Discharge: 2019-02-01 | Disposition: A | Discharge status: Home / Self Care | Payer: Medicare Other | Source: Ambulatory Visit | Attending: Infectious Diseases | Admitting: Infectious Diseases

## 2019-02-01 ENCOUNTER — Other Ambulatory Visit: Payer: Self-pay

## 2019-02-01 DIAGNOSIS — M009 Pyogenic arthritis, unspecified: Secondary | ICD-10-CM | POA: Insufficient documentation

## 2019-02-01 MED ORDER — SODIUM CHLORIDE 0.9 % IV SOLN
1.0000 g | INTRAVENOUS | Status: DC
  Administered 2019-02-01: 1000 mg via INTRAVENOUS
  Filled 2019-02-01: qty 1

## 2019-02-01 MED ORDER — HEPARIN SOD (PORK) LOCK FLUSH 100 UNIT/ML IV SOLN
250.0000 [IU] | INTRAVENOUS | Status: AC | PRN
  Administered 2019-02-01: 250 [IU]

## 2019-02-02 ENCOUNTER — Ambulatory Visit (HOSPITAL_COMMUNITY)
Admission: RE | Admit: 2019-02-02 | Discharge: 2019-02-02 | Disposition: A | Discharge status: Home / Self Care | Payer: Medicare Other | Source: Ambulatory Visit | Attending: Infectious Diseases | Admitting: Infectious Diseases

## 2019-02-02 DIAGNOSIS — M048 Other autoinflammatory syndromes: Secondary | ICD-10-CM | POA: Diagnosis present

## 2019-02-02 MED ORDER — HEPARIN SOD (PORK) LOCK FLUSH 100 UNIT/ML IV SOLN
250.0000 [IU] | INTRAVENOUS | Status: DC | PRN
  Administered 2019-02-02: 250 [IU]

## 2019-02-02 MED ORDER — HEPARIN SOD (PORK) LOCK FLUSH 100 UNIT/ML IV SOLN
INTRAVENOUS | Status: AC
  Administered 2019-02-02: 250 [IU]
  Filled 2019-02-02: qty 5

## 2019-02-02 MED ORDER — SODIUM CHLORIDE 0.9 % IV SOLN
1.0000 g | INTRAVENOUS | Status: DC
  Administered 2019-02-02: 1000 mg via INTRAVENOUS
  Filled 2019-02-02: qty 1

## 2019-02-03 ENCOUNTER — Encounter: Payer: Self-pay | Admitting: Infectious Diseases

## 2019-02-03 ENCOUNTER — Ambulatory Visit: Payer: Medicare Other | Admitting: Infectious Diseases

## 2019-02-03 ENCOUNTER — Ambulatory Visit (HOSPITAL_COMMUNITY)
Admission: RE | Admit: 2019-02-03 | Discharge: 2019-02-03 | Disposition: A | Discharge status: Home / Self Care | Payer: Medicare Other | Source: Ambulatory Visit | Attending: Infectious Diseases | Admitting: Infectious Diseases

## 2019-02-03 ENCOUNTER — Other Ambulatory Visit: Payer: Self-pay

## 2019-02-03 ENCOUNTER — Ambulatory Visit (INDEPENDENT_AMBULATORY_CARE_PROVIDER_SITE_OTHER): Payer: Medicare Other | Admitting: Infectious Diseases

## 2019-02-03 VITALS — BP 157/93 | HR 71 | Temp 98.7°F | Wt 150.0 lb

## 2019-02-03 DIAGNOSIS — A280 Pasteurellosis: Secondary | ICD-10-CM

## 2019-02-03 DIAGNOSIS — M048 Other autoinflammatory syndromes: Secondary | ICD-10-CM | POA: Diagnosis present

## 2019-02-03 DIAGNOSIS — M00841 Arthritis due to other bacteria, right hand: Secondary | ICD-10-CM | POA: Diagnosis present

## 2019-02-03 LAB — CBC WITH DIFFERENTIAL/PLATELET
Abs Immature Granulocytes: 0.02 10*3/uL (ref 0.00–0.07)
Basophils Absolute: 0.1 10*3/uL (ref 0.0–0.1)
Basophils Relative: 1 %
Eosinophils Absolute: 0.4 10*3/uL (ref 0.0–0.5)
Eosinophils Relative: 7 %
HCT: 38 % (ref 36.0–46.0)
Hemoglobin: 12.5 g/dL (ref 12.0–15.0)
Immature Granulocytes: 0 %
Lymphocytes Relative: 30 %
Lymphs Abs: 1.8 10*3/uL (ref 0.7–4.0)
MCH: 29.6 pg (ref 26.0–34.0)
MCHC: 32.9 g/dL (ref 30.0–36.0)
MCV: 89.8 fL (ref 80.0–100.0)
Monocytes Absolute: 0.6 10*3/uL (ref 0.1–1.0)
Monocytes Relative: 10 %
Neutro Abs: 3.1 10*3/uL (ref 1.7–7.7)
Neutrophils Relative %: 52 %
Platelets: 231 10*3/uL (ref 150–400)
RBC: 4.23 MIL/uL (ref 3.87–5.11)
RDW: 12.5 % (ref 11.5–15.5)
WBC: 5.9 10*3/uL (ref 4.0–10.5)
nRBC: 0 % (ref 0.0–0.2)

## 2019-02-03 LAB — BASIC METABOLIC PANEL
Anion gap: 8 (ref 5–15)
BUN: 13 mg/dL (ref 6–20)
CO2: 25 mmol/L (ref 22–32)
Calcium: 9.3 mg/dL (ref 8.9–10.3)
Chloride: 107 mmol/L (ref 98–111)
Creatinine, Ser: 1 mg/dL (ref 0.44–1.00)
GFR calc Af Amer: >60 mL/min (ref >60)
GFR calc non Af Amer: >60 mL/min (ref >60)
Glucose, Bld: 76 mg/dL (ref 70–99)
Potassium: 4 mmol/L (ref 3.5–5.1)
Sodium: 140 mmol/L (ref 135–145)

## 2019-02-03 LAB — C-REACTIVE PROTEIN: CRP: <0.8 mg/dL (ref <1.0)

## 2019-02-03 MED ORDER — HEPARIN SOD (PORK) LOCK FLUSH 100 UNIT/ML IV SOLN: 250.0000 [IU] | INTRAVENOUS | Status: DC | PRN

## 2019-02-03 MED ORDER — HEPARIN SOD (PORK) LOCK FLUSH 100 UNIT/ML IV SOLN
INTRAVENOUS | Status: AC
  Administered 2019-02-03: 250 [IU]
  Filled 2019-02-03: qty 5

## 2019-02-03 MED ORDER — SODIUM CHLORIDE 0.9 % IV SOLN
1.0000 g | INTRAVENOUS | Status: DC
  Administered 2019-02-03: 1000 mg via INTRAVENOUS
  Filled 2019-02-03: qty 1

## 2019-02-03 NOTE — Patient Instructions (Signed)
Contact your orthopedist re: exercises to improve range of motion to RT hand following IV ABX treatment.

## 2019-02-03 NOTE — Progress Notes (Signed)
Subjective:    Patient ID: Audrey Becker, female    DOB: Mar 13, 1971, 48 y.o.   MRN: [id number]  HPI The patient is a pleasant 48 year old white female with a remote history of breast cancer presenting today for an evaluation for a right hand septic joint infection.  She was last seen in our clinic on Dec 30, 2018 and shortly after had a PICC line placed and initiated IV antibiotics with ertapenem. 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  The following 24 hours she developed significant redness pain and erythema to her right index finger and ultimately required an I&D performed by Dr. Percell Miller on September 22, 2018.  Operative cultures subsequently grew Pasteurella multocida.  A preoperative MRI performed on February third 2020 showed findings consistent with cellulitis of the dorsum of the hand with tenosynovitis of the flexor and extensor tendons as well as 2 tiny superficial abscesses in the soft tissue of the dorsum of the hand overlying the second and fourth MCP joints.  She received a three-week course of oral Augmentin with initial improvement, but several weeks later again developing worsening right index finger joint pain and mild erythema with paresthesias to her affected joint.    Since having her PICC line placed and initiating IV ertapenem, the patient has continued to improve.  She has not missed any doses of ertapenem and completed her last infusion yesterday at the same day surgery outpatient center.  Her treatment course was slightly extended in order to allow for reassessment at today's visit.  Her CRP this past week was less than 0.8 and her white blood cell count has remained normal the last several weeks.  She now has much greater function to her right hand and can actually make a full fist.  She is anxious to have her PICC line removed today.  While she did reduce her smoking, she continues to smoke cigarettes daily.    Past Medical History:  Diagnosis Date  . Anxiety   . Cancer ([place])    stage IIIB LT breast CA, dx'ed in 2006, s/p mastectomy, XRT, and chemo  . Cat bite 09/21/2018  . Depression     Past Surgical History:  Procedure Laterality Date  . BREAST SURGERY    . I&D EXTREMITY Right 09/22/2018   Procedure: IRRIGATION AND DEBRIDEMENT RIGHT DORSAL HAND;  Surgeon: Renette Butters, MD;  Location: [facility];  Service: Orthopedics;  Laterality: Right;     No family history on file. +DM, HTN, and OA with multiple family members.  Social History   Tobacco Use  . Smoking status: Current Every Day Smoker    Packs/day: 0.50    Years: 15.00    Pack years: 7.50    Types: Cigarettes  . Smokeless tobacco: Never Used  Substance Use Topics  . Alcohol use: Yes    Comment: 1-2 glasses of wine/mo.   . Drug use: Never      reports previously being sexually active.   Outpatient Medications Prior to Visit  Medication Sig Dispense Refill  . diclofenac sodium (VOLTAREN) 1 % GEL Apply 2 g topically 4 (four) times daily as needed (pain).    . DULoxetine (CYMBALTA) 60 MG capsule Take 60 mg by mouth 2 (two) times daily.    . ertapenem 1,000 mg in sodium chloride 0.9 % 100 mL Inject 1,000 mg into the vein daily. 28 Doses/Fill 0  . hydroxypropyl methylcellulose /  hypromellose (ISOPTO TEARS / GONIOVISC) 2.5 % ophthalmic solution Place 1 drop into both eyes 3 (three) times daily as needed for dry eyes.    . hydrOXYzine (ATARAX/VISTARIL) 25 MG tablet Take 25 mg by mouth 3 (three) times daily as needed for anxiety.    . lamoTRIgine (LAMICTAL) 150 MG tablet Take 150 mg by mouth at bedtime.    . Multiple Vitamin (MULTIVITAMIN WITH MINERALS) TABS tablet Take 1 tablet by mouth daily.    . traZODone (DESYREL) 100 MG tablet Take 100 mg by mouth at bedtime.    . ertapenem (INVANZ) 1,000 mg in sodium chloride 0.9 % 100 mL IVPB     . heparin lock flush 100 unit/mL      No facility-administered medications prior to visit.       No Known Allergies    Review of Systems  Constitutional: Negative for chills, fatigue and fever.  HENT: Negative for congestion, hearing loss and sinus pressure.   Eyes: Negative for photophobia, discharge, redness and visual disturbance.  Respiratory: Negative for apnea, cough, shortness of breath and wheezing.   Cardiovascular: Negative for chest pain and leg swelling.  Gastrointestinal: Negative for abdominal distention, abdominal pain, constipation, diarrhea, nausea and vomiting.  Endocrine: Negative for cold intolerance, heat intolerance, polydipsia and polyuria.  Genitourinary: Negative for dysuria, flank pain, frequency, urgency, vaginal bleeding and vaginal discharge.  Musculoskeletal: Negative for arthralgias, back pain, joint swelling and neck pain.  Skin: Negative for pallor and rash.  Allergic/Immunologic: Negative for immunocompromised state.  Neurological: Negative for dizziness, seizures, speech difficulty, weakness and headaches.  Hematological: Does not bruise/bleed easily.  Psychiatric/Behavioral: Negative for agitation, confusion, hallucinations and sleep disturbance. The patient is not nervous/anxious.        Objective:    Vitals:   02/03/19 1441  BP: (!) 157/93  Pulse: 71  Temp: 98.7 F (37.1 C)   Physical Exam Gen: pleasant, NAD, A&Ox 3 Head: NCAT, no temporal wasting evident EENT: PERRL, EOMI, MMM, adequate dentition Neck: supple, no JVD CV: NRRR, no murmurs evident Pulm: CTA bilaterally, no wheeze or retractions Abd: soft, NTND, +BS Extrems: no LE edema, 2+ pulses MSK: normal ROM to RT wrist and all 5 digits to RT hand, easily able to make a fist with her RT hand now Skin: no rashes, adequate skin turgor Neuro: CN II-XII grossly intact, no focal neurologic deficits appreciated, gait was normal, A&Ox 3   Labs: Lab Results  Component Value Date   WBC 5.9 02/03/2019   HGB 12.5 02/03/2019   HCT 38.0 02/03/2019   MCV 89.8 02/03/2019   PLT 231  02/03/2019   Lab Results  Component Value Date   NA 140 02/03/2019   K 4.0 02/03/2019   CL 107 02/03/2019   CO2 25 02/03/2019   GLUCOSE 76 02/03/2019   BUN 13 02/03/2019   CREATININE 1.00 02/03/2019   CALCIUM 9.3 02/03/2019   Lab Results  Component Value Date   CRP <0.8 02/03/2019       Assessment & Plan:  Patient is a 48 year old white female with remote history of breast cancer recent right second MCP septic joint infection following recent cat bite.  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  Although she underwent an I&D to her hand in February and received 3 weeks of oral antibiotics postoperatively, this likely was not adequate to treat a true joint infection but would have treated her tenosynovitis/soft  tissue abscesses only.  Since completing oral treatment, she continued to have articular findings particularly to her right index finger/second MCP joint.  She has responded quite nicely to more than a 4-week course of ertapenem IV daily with normalization of her white blood cell count and CRP.  Clinically, she is now able to make a full fist to her right hand although slowed and reports far less joint pain to her right hand today compared to 4 weeks ago.  Her PICC line was removed in the office today and her treatment is deemed complete.  Tobacco abuse- I have continued to advise that she stop smoking cigarettes at least for the next 6 months to assist with wound healing and recovery of her joint function.  She has been educated regarding the vasoconstrictive properties of nicotine and how this contributes to poor wound healing as well as joint remodeling.  She expressed understanding and agreed to continue to work on stopping her cigarette habit.

## 2019-02-03 NOTE — Progress Notes (Signed)
Per verbal order from Dr Prince Rome, 37 cm Single Lumen Peripherally Inserted Central Catheter removed from right basilic, tip intact. No sutures present. RN confirmed length per chart. Dressing was clean and dry. Petroleum dressing applied. Pt advised no heavy lifting with this arm, leave dressing for 24 hours and call the office or seek emergent care if dressing becomes soaked with blood or swelling, difficulty breathing, or chest pain presents. Patient verbalized understanding and agreement.  Patient's questions answered to their satisfaction. Patient tolerated procedure well, RN directed patient to check out. Cassie Kuppelweiser, pharmD, notified. Landis Gandy, RN

## 2019-02-04 ENCOUNTER — Inpatient Hospital Stay (HOSPITAL_COMMUNITY)
Admission: RE | Admit: 2019-02-04 | Discharge: 2019-02-04 | Disposition: A | Discharge status: Home / Self Care | Payer: Medicare Other | Source: Ambulatory Visit | Attending: Infectious Diseases | Admitting: Infectious Diseases

## 2019-05-23 ENCOUNTER — Encounter: Payer: Self-pay | Admitting: Infectious Diseases

## 2019-08-09 ENCOUNTER — Emergency Department (HOSPITAL_COMMUNITY): Payer: Medicare Other

## 2019-08-09 ENCOUNTER — Other Ambulatory Visit: Payer: Self-pay

## 2019-08-09 ENCOUNTER — Emergency Department (HOSPITAL_COMMUNITY)
Admission: EM | Admit: 2019-08-09 | Discharge: 2019-08-09 | Disposition: A | Discharge status: Home / Self Care | Payer: Medicare Other | Attending: Emergency Medicine | Admitting: Emergency Medicine

## 2019-08-09 ENCOUNTER — Encounter (HOSPITAL_COMMUNITY): Payer: Self-pay | Admitting: Emergency Medicine

## 2019-08-09 DIAGNOSIS — F17210 Nicotine dependence, cigarettes, uncomplicated: Secondary | ICD-10-CM | POA: Diagnosis not present

## 2019-08-09 DIAGNOSIS — M542 Cervicalgia: Secondary | ICD-10-CM | POA: Insufficient documentation

## 2019-08-09 DIAGNOSIS — S20219A Contusion of unspecified front wall of thorax, initial encounter: Secondary | ICD-10-CM | POA: Insufficient documentation

## 2019-08-09 DIAGNOSIS — Y929 Unspecified place or not applicable: Secondary | ICD-10-CM | POA: Insufficient documentation

## 2019-08-09 DIAGNOSIS — Y999 Unspecified external cause status: Secondary | ICD-10-CM | POA: Insufficient documentation

## 2019-08-09 DIAGNOSIS — Y939 Activity, unspecified: Secondary | ICD-10-CM | POA: Insufficient documentation

## 2019-08-09 DIAGNOSIS — R519 Headache, unspecified: Secondary | ICD-10-CM | POA: Insufficient documentation

## 2019-08-09 DIAGNOSIS — Z853 Personal history of malignant neoplasm of breast: Secondary | ICD-10-CM | POA: Insufficient documentation

## 2019-08-09 DIAGNOSIS — M546 Pain in thoracic spine: Secondary | ICD-10-CM | POA: Insufficient documentation

## 2019-08-09 DIAGNOSIS — Z79899 Other long term (current) drug therapy: Secondary | ICD-10-CM | POA: Insufficient documentation

## 2019-08-09 DIAGNOSIS — R0789 Other chest pain: Secondary | ICD-10-CM | POA: Insufficient documentation

## 2019-08-09 LAB — COMPREHENSIVE METABOLIC PANEL
ALT: 29 U/L (ref 0–44)
AST: 32 U/L (ref 15–41)
Albumin: 4 g/dL (ref 3.5–5.0)
Alkaline Phosphatase: 66 U/L (ref 38–126)
Anion gap: 8 (ref 5–15)
BUN: 16 mg/dL (ref 6–20)
CO2: 24 mmol/L (ref 22–32)
Calcium: 9.6 mg/dL (ref 8.9–10.3)
Chloride: 104 mmol/L (ref 98–111)
Creatinine, Ser: 0.96 mg/dL (ref 0.44–1.00)
GFR calc Af Amer: >60 mL/min (ref >60)
GFR calc non Af Amer: >60 mL/min (ref >60)
Glucose, Bld: 87 mg/dL (ref 70–99)
Potassium: 4.2 mmol/L (ref 3.5–5.1)
Sodium: 136 mmol/L (ref 135–145)
Total Bilirubin: 0.9 mg/dL (ref 0.3–1.2)
Total Protein: 6.9 g/dL (ref 6.5–8.1)

## 2019-08-09 LAB — CBC
HCT: 42.4 % (ref 36.0–46.0)
Hemoglobin: 14.4 g/dL (ref 12.0–15.0)
MCH: 30.5 pg (ref 26.0–34.0)
MCHC: 34 g/dL (ref 30.0–36.0)
MCV: 89.8 fL (ref 80.0–100.0)
Platelets: 252 10*3/uL (ref 150–400)
RBC: 4.72 MIL/uL (ref 3.87–5.11)
RDW: 13.1 % (ref 11.5–15.5)
WBC: 8.5 10*3/uL (ref 4.0–10.5)
nRBC: 0 % (ref 0.0–0.2)

## 2019-08-09 LAB — I-STAT BETA HCG BLOOD, ED (MC, WL, AP ONLY): I-stat hCG, quantitative: <5 m[IU]/mL (ref <5)

## 2019-08-09 MED ORDER — IOHEXOL 300 MG/ML  SOLN
100.0000 mL | Freq: Once | INTRAMUSCULAR | Status: AC | PRN
  Administered 2019-08-09: 100 mL via INTRAVENOUS

## 2019-08-09 MED ORDER — ACETAMINOPHEN 325 MG PO TABS
650.0000 mg | ORAL_TABLET | Freq: Once | ORAL | Status: AC
  Administered 2019-08-09: 650 mg via ORAL
  Filled 2019-08-09: qty 2

## 2019-08-09 MED ORDER — CYCLOBENZAPRINE HCL 10 MG PO TABS: 10.0000 mg | ORAL_TABLET | Freq: Two times a day (BID) | ORAL | 0 refills | Status: AC | PRN

## 2019-08-09 MED ORDER — MORPHINE SULFATE (PF) 4 MG/ML IV SOLN
4.0000 mg | Freq: Once | INTRAVENOUS | Status: AC
  Administered 2019-08-09: 4 mg via INTRAVENOUS
  Filled 2019-08-09: qty 1

## 2019-08-09 MED ORDER — CYCLOBENZAPRINE HCL 10 MG PO TABS
5.0000 mg | ORAL_TABLET | Freq: Once | ORAL | Status: AC
  Administered 2019-08-09: 5 mg via ORAL
  Filled 2019-08-09: qty 1
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
## 2019-08-09 NOTE — ED Notes (Signed)
To x-ray
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
## 2020-07-06 ENCOUNTER — Other Ambulatory Visit: Payer: Self-pay | Admitting: Orthopedic Surgery

## 2020-07-06 DIAGNOSIS — M259 Joint disorder, unspecified: Secondary | ICD-10-CM

## 2020-07-19 ENCOUNTER — Ambulatory Visit
Admission: EM | Admit: 2020-07-19 | Discharge: 2020-07-19 | Discharge status: Against Medical Advice | Payer: Medicare Other

## 2020-07-21 ENCOUNTER — Ambulatory Visit
Admission: RE | Admit: 2020-07-21 | Discharge: 2020-07-21 | Disposition: A | Discharge status: Home / Self Care | Payer: Medicare Other | Source: Ambulatory Visit | Attending: Emergency Medicine | Admitting: Emergency Medicine

## 2020-07-21 ENCOUNTER — Other Ambulatory Visit: Payer: Self-pay

## 2020-07-21 VITALS — BP 149/91 | HR 82 | Temp 98.2°F | Resp 16

## 2020-07-21 DIAGNOSIS — B372 Candidiasis of skin and nail: Secondary | ICD-10-CM | POA: Diagnosis present

## 2020-07-21 LAB — POCT URINALYSIS DIP (MANUAL ENTRY)
Bilirubin, UA: NEGATIVE
Blood, UA: NEGATIVE
Glucose, UA: NEGATIVE mg/dL
Ketones, POC UA: NEGATIVE mg/dL
Leukocytes, UA: NEGATIVE
Nitrite, UA: NEGATIVE
Protein Ur, POC: NEGATIVE mg/dL
Spec Grav, UA: 1.025 (ref 1.010–1.025)
Urobilinogen, UA: 0.2 E.U./dL
pH, UA: 7 (ref 5.0–8.0)

## 2020-07-21 MED ORDER — CLOTRIMAZOLE-BETAMETHASONE 1-0.05 % EX CREA: TOPICAL_CREAM | CUTANEOUS | 0 refills | Status: AC

## 2020-07-21 MED ORDER — FLUCONAZOLE 150 MG PO TABS: ORAL_TABLET | ORAL | 0 refills | Status: AC

## 2020-07-21 NOTE — ED Triage Notes (Signed)
Pt said she has been having itching and discomfort in her vagina area. Pt said no smell, some swelling and itching in the crease of her legs also. Some burning bc of the labia being so swollen and itching so bad

## 2020-07-21 NOTE — Discharge Instructions (Signed)
Apply Lotrisone twice daily to groin/external labia 1 tablet of Diflucan today, repeat second tablet in 72 hours Please allow groin area to area out when possible Follow up if not improving or worsening

## 2020-07-21 NOTE — ED Provider Notes (Signed)
[facility]    CSN: [id number] Arrival date & time: 07/21/20  1248      History   Chief Complaint Chief Complaint  Patient presents with  . Vaginitis    itching    HPI Audrey Becker is a 49 y.o. female presenting today for evaluation of rash to groin and vaginal itching.  Reports symptoms have been going on for a few days and have progressively worsened.  Skin has become very irritated and raw. Denies new partners/exposures.   HPI  Past Medical History:  Diagnosis Date  . Anxiety   . Cancer ([place])    stage IIIB LT breast CA, dx'ed in 2006, s/p mastectomy, XRT, and chemo  . Cat bite 09/21/2018  . Depression     Patient Active Problem List   Diagnosis Date Noted  . Cellulitis of right upper extremity   . Infected cat bite of hand, right, initial encounter 09/22/2018  . History of bilateral breast cancer 09/22/2018  . Depression with anxiety 09/22/2018  . Cat bite   . Septic joint of right hand ([place]) 09/2018    Past Surgical History:  Procedure Laterality Date  . BREAST SURGERY    . I & D EXTREMITY Right 09/22/2018   Procedure: IRRIGATION AND DEBRIDEMENT RIGHT DORSAL HAND;  Surgeon: Renette Butters, MD;  Location: [facility];  Service: Orthopedics;  Laterality: Right;    OB History   No obstetric history on file.      Home Medications    Prior to Admission medications   Medication Sig Start Date End Date Taking? Authorizing Provider  clotrimazole-betamethasone (LOTRISONE) cream Apply to affected area 2 times daily prn 07/21/20   Diondre Pulis C, PA-C  diclofenac sodium (VOLTAREN) 1 % GEL Apply 2 g topically 4 (four) times daily as needed (pain).    [provider]  DULoxetine (CYMBALTA) 60 MG capsule Take 60 mg by mouth 2 (two) times daily.    [provider]  ertapenem 1,000 mg in sodium chloride 0.9 % 100 mL Inject 1,000 mg into the vein daily. 12/30/18   Powers, Evern Core, MD  fluconazole (DIFLUCAN) 150 MG tablet Take 1 tablet  today, take second tablet in 72 hours 07/21/20   Masiya Claassen, [employer] C, PA-C  hydroxypropyl methylcellulose / hypromellose (ISOPTO TEARS / GONIOVISC) 2.5 % ophthalmic solution Place 1 drop into both eyes 3 (three) times daily as needed for dry eyes.    [provider]  hydrOXYzine (ATARAX/VISTARIL) 25 MG tablet Take 25 mg by mouth 3 (three) times daily as needed for anxiety.    [provider]  lamoTRIgine (LAMICTAL) 150 MG tablet Take 150 mg by mouth at bedtime.    [provider]  Multiple Vitamin (MULTIVITAMIN WITH MINERALS) TABS tablet Take 1 tablet by mouth daily.    [provider]  traZODone (DESYREL) 100 MG tablet Take 100 mg by mouth at bedtime.    [provider]    Family History History reviewed. No pertinent family history.  Social History Social History   Tobacco Use  . Smoking status: Current Every Day Smoker    Packs/day: 0.50    Years: 15.00    Pack years: 7.50    Types: Cigarettes  . Smokeless tobacco: Never Used  Vaping Use  . Vaping Use: Never used  Substance Use Topics  . Alcohol use: Yes    Comment: 1-2 glasses of wine/mo.   . Drug use: Never     Allergies   Patient  has no known allergies.   Review of Systems Review of Systems  Constitutional: Negative for fatigue and fever.  Eyes: Negative for visual disturbance.  Respiratory: Negative for shortness of breath.   Cardiovascular: Negative for chest pain.  Gastrointestinal: Negative for abdominal pain, nausea and vomiting.  Musculoskeletal: Negative for arthralgias and joint swelling.  Skin: Positive for color change and rash. Negative for wound.  Neurological: Negative for dizziness, weakness, light-headedness and headaches.     Physical Exam Triage Vital Signs ED Triage Vitals  Enc Vitals Group     BP      Pulse      Resp      Temp      Temp src      SpO2      Weight      Height      Head Circumference      Peak Flow      Pain Score      Pain  Loc      Pain Edu?      Excl. in [place]?    No data found.  Updated Vital Signs BP (!) 149/91 (BP Location: Right Arm)   Pulse 82   Temp 98.2 F (36.8 C) (Oral)   Resp 16   SpO2 96%   Visual Acuity Right Eye Distance:   Left Eye Distance:   Bilateral Distance:    Right Eye Near:   Left Eye Near:    Bilateral Near:     Physical Exam Vitals and nursing note reviewed.  Constitutional:      Appearance: She is well-developed.     Comments: No acute distress  HENT:     Head: Normocephalic and atraumatic.     Nose: Nose normal.  Eyes:     Conjunctiva/sclera: Conjunctivae normal.  Cardiovascular:     Rate and Rhythm: Normal rate.  Pulmonary:     Effort: Pulmonary effort is normal. No respiratory distress.  Abdominal:     General: There is no distension.  Genitourinary:    Comments: Bilateral inguinal areas with mild erythema and evidence of excoriation, mild labial erythema, no significant vaginal discharge noted Musculoskeletal:        General: Normal range of motion.     Cervical back: Neck supple.  Skin:    General: Skin is warm and dry.  Neurological:     Mental Status: She is alert and oriented to person, place, and time.      UC Treatments / Results  Labs (all labs ordered are listed, but only abnormal results are displayed) Labs Reviewed  POCT URINALYSIS DIP (MANUAL ENTRY)  CERVICOVAGINAL ANCILLARY ONLY    EKG   Radiology No results found.  Procedures Procedures (including critical care time)  Medications Ordered in UC Medications - No data to display  Initial Impression / Assessment and Plan / UC Course  I have reviewed the triage vital signs and the nursing notes.  Pertinent labs & imaging results that were available during my care of the patient were reviewed by me and considered in my medical decision making (see chart for details).     Treating for yeast dermatitis and providing Lotrisone to apply twice daily, Diflucan orally, keep area  clean and dry.  Vaginal swab pending to screen for any vaginal infections.  Low suspicion of STDs.  Continue to monitor,Discussed strict return precautions. Patient verbalized understanding and is agreeable with plan.   Final Clinical Impressions(s) / UC Diagnoses   Final diagnoses:  Yeast  dermatitis     Discharge Instructions     Apply Lotrisone twice daily to groin/external labia 1 tablet of Diflucan today, repeat second tablet in 72 hours Please allow groin area to area out when possible Follow up if not improving or worsening    ED Prescriptions    Medication Sig Dispense Auth. Provider   clotrimazole-betamethasone (LOTRISONE) cream Apply to affected area 2 times daily prn 45 g Rosilyn Coachman C, PA-C   fluconazole (DIFLUCAN) 150 MG tablet Take 1 tablet today, take second tablet in 72 hours 2 tablet Brockton Mckesson C, PA-C     PDMP not reviewed this encounter.   Janith Lima, [place] 07/21/20 1349

## 2020-07-22 LAB — CERVICOVAGINAL ANCILLARY ONLY
Bacterial Vaginitis (gardnerella): NEGATIVE
Candida Glabrata: NEGATIVE
Candida Vaginitis: NEGATIVE
Comment: NEGATIVE
Comment: NEGATIVE
Comment: NEGATIVE

## 2020-07-27 ENCOUNTER — Other Ambulatory Visit: Payer: Self-pay

## 2020-07-27 ENCOUNTER — Ambulatory Visit
Admission: RE | Admit: 2020-07-27 | Discharge: 2020-07-27 | Disposition: A | Discharge status: Home / Self Care | Payer: Medicare Other | Source: Ambulatory Visit | Attending: Orthopedic Surgery | Admitting: Orthopedic Surgery

## 2020-07-27 DIAGNOSIS — M259 Joint disorder, unspecified: Secondary | ICD-10-CM

## 2020-08-05 ENCOUNTER — Other Ambulatory Visit: Payer: Self-pay | Admitting: Orthopedic Surgery

## 2020-08-05 DIAGNOSIS — M259 Joint disorder, unspecified: Secondary | ICD-10-CM

## 2020-08-26 ENCOUNTER — Other Ambulatory Visit: Payer: Self-pay

## 2020-08-26 ENCOUNTER — Ambulatory Visit
Admission: RE | Admit: 2020-08-26 | Discharge: 2020-08-26 | Disposition: A | Discharge status: Home / Self Care | Payer: Medicare Other | Source: Ambulatory Visit | Attending: Orthopedic Surgery | Admitting: Orthopedic Surgery

## 2020-08-26 DIAGNOSIS — M259 Joint disorder, unspecified: Secondary | ICD-10-CM

## 2020-08-26 MED ORDER — METHYLPREDNISOLONE ACETATE 40 MG/ML INJ SUSP (RADIOLOG
120.0000 mg | Freq: Once | INTRAMUSCULAR | Status: AC
  Administered 2020-08-26: 120 mg via INTRA_ARTICULAR
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
## 2021-07-10 ENCOUNTER — Ambulatory Visit
Admission: EM | Admit: 2021-07-10 | Discharge: 2021-07-10 | Disposition: A | Discharge status: Home / Self Care | Payer: Medicare Other
